# Patient Record
Sex: Female | Born: 1935 | Race: White | Hispanic: No | Smoking: Never smoker
Health system: Southern US, Community
[De-identification: ages and names within clinical notes are randomized; demographics above are authoritative.]

## PROBLEM LIST (undated history)

## (undated) DIAGNOSIS — F0390 Unspecified dementia without behavioral disturbance: Secondary | ICD-10-CM

## (undated) DIAGNOSIS — R7989 Other specified abnormal findings of blood chemistry: Secondary | ICD-10-CM

## (undated) DIAGNOSIS — Z9889 Other specified postprocedural states: Secondary | ICD-10-CM

## (undated) DIAGNOSIS — C3490 Malignant neoplasm of unspecified part of unspecified bronchus or lung: Secondary | ICD-10-CM

## (undated) HISTORY — PX: BREAST REDUCTION SURGERY: SHX8

## (undated) HISTORY — PX: OTHER SURGICAL HISTORY: SHX169

## (undated) HISTORY — PX: CARPAL TUNNEL RELEASE: SHX101

## (undated) HISTORY — PX: ABDOMINAL HYSTERECTOMY: SHX81

## (undated) HISTORY — PX: BACK SURGERY: SHX140

---

## 2013-02-15 ENCOUNTER — Encounter (HOSPITAL_COMMUNITY): Payer: Self-pay

## 2013-02-15 ENCOUNTER — Inpatient Hospital Stay (HOSPITAL_COMMUNITY)
Admission: EM | Admit: 2013-02-15 | Discharge: 2013-02-17 | DRG: 640 | Disposition: A | Discharge status: Skilled Nursing Facility | Payer: Medicare Other | Attending: Internal Medicine | Admitting: Internal Medicine

## 2013-02-15 ENCOUNTER — Emergency Department (HOSPITAL_COMMUNITY): Payer: Medicare Other

## 2013-02-15 DIAGNOSIS — R339 Retention of urine, unspecified: Secondary | ICD-10-CM | POA: Diagnosis present

## 2013-02-15 DIAGNOSIS — Z85118 Personal history of other malignant neoplasm of bronchus and lung: Secondary | ICD-10-CM

## 2013-02-15 DIAGNOSIS — I4510 Unspecified right bundle-branch block: Secondary | ICD-10-CM

## 2013-02-15 DIAGNOSIS — D72829 Elevated white blood cell count, unspecified: Secondary | ICD-10-CM | POA: Diagnosis present

## 2013-02-15 DIAGNOSIS — T502X5A Adverse effect of carbonic-anhydrase inhibitors, benzothiadiazides and other diuretics, initial encounter: Secondary | ICD-10-CM | POA: Diagnosis present

## 2013-02-15 DIAGNOSIS — N3289 Other specified disorders of bladder: Secondary | ICD-10-CM | POA: Diagnosis present

## 2013-02-15 DIAGNOSIS — E872 Acidosis, unspecified: Secondary | ICD-10-CM | POA: Diagnosis present

## 2013-02-15 DIAGNOSIS — E876 Hypokalemia: Secondary | ICD-10-CM

## 2013-02-15 DIAGNOSIS — E878 Other disorders of electrolyte and fluid balance, not elsewhere classified: Secondary | ICD-10-CM

## 2013-02-15 DIAGNOSIS — E871 Hypo-osmolality and hyponatremia: Principal | ICD-10-CM | POA: Diagnosis present

## 2013-02-15 DIAGNOSIS — Z79899 Other long term (current) drug therapy: Secondary | ICD-10-CM

## 2013-02-15 DIAGNOSIS — R4182 Altered mental status, unspecified: Secondary | ICD-10-CM

## 2013-02-15 DIAGNOSIS — E861 Hypovolemia: Secondary | ICD-10-CM | POA: Diagnosis present

## 2013-02-15 DIAGNOSIS — G9340 Encephalopathy, unspecified: Secondary | ICD-10-CM

## 2013-02-15 DIAGNOSIS — I10 Essential (primary) hypertension: Secondary | ICD-10-CM | POA: Diagnosis present

## 2013-02-15 DIAGNOSIS — F0390 Unspecified dementia without behavioral disturbance: Secondary | ICD-10-CM | POA: Diagnosis present

## 2013-02-15 HISTORY — DX: Unspecified dementia, unspecified severity, without behavioral disturbance, psychotic disturbance, mood disturbance, and anxiety: F03.90

## 2013-02-15 HISTORY — DX: Malignant neoplasm of unspecified part of unspecified bronchus or lung: C34.90

## 2013-02-15 HISTORY — DX: Other specified abnormal findings of blood chemistry: R79.89

## 2013-02-15 HISTORY — DX: Other specified postprocedural states: Z98.890

## 2013-02-15 LAB — COMPREHENSIVE METABOLIC PANEL
ALT: 10 U/L (ref 0–35)
Albumin: 3.7 g/dL (ref 3.5–5.2)
Calcium: 9.8 mg/dL (ref 8.4–10.5)
GFR calc Af Amer: 89 mL/min — ABNORMAL LOW (ref >90)
Glucose, Bld: 146 mg/dL — ABNORMAL HIGH (ref 70–99)
Sodium: 121 mEq/L — ABNORMAL LOW (ref 135–145)
Total Protein: 6.4 g/dL (ref 6.0–8.3)

## 2013-02-15 LAB — URINALYSIS, ROUTINE W REFLEX MICROSCOPIC
Bilirubin Urine: NEGATIVE
Glucose, UA: 100 mg/dL — AB
Leukocytes, UA: NEGATIVE
Nitrite: NEGATIVE
Specific Gravity, Urine: 1.015 (ref 1.005–1.030)
pH: 6.5 (ref 5.0–8.0)

## 2013-02-15 LAB — GLUCOSE, CAPILLARY: Glucose-Capillary: 151 mg/dL — ABNORMAL HIGH (ref 70–99)

## 2013-02-15 LAB — CBC WITH DIFFERENTIAL/PLATELET
Basophils Absolute: 0 10*3/uL (ref 0.0–0.1)
Basophils Relative: 0 % (ref 0–1)
Eosinophils Absolute: 0 10*3/uL (ref 0.0–0.7)
Eosinophils Relative: 0 % (ref 0–5)
Lymphs Abs: 1.5 10*3/uL (ref 0.7–4.0)
MCH: 28.5 pg (ref 26.0–34.0)
MCHC: 35.1 g/dL (ref 30.0–36.0)
MCV: 81.2 fL (ref 78.0–100.0)
Neutrophils Relative %: 88 % — ABNORMAL HIGH (ref 43–77)
Platelets: 331 10*3/uL (ref 150–400)
RDW: 12.2 % (ref 11.5–15.5)

## 2013-02-15 LAB — BASIC METABOLIC PANEL
BUN: 7 mg/dL (ref 6–23)
CO2: 30 mEq/L (ref 19–32)
Calcium: 9.5 mg/dL (ref 8.4–10.5)
Creatinine, Ser: 0.79 mg/dL (ref 0.50–1.10)
Glucose, Bld: 112 mg/dL — ABNORMAL HIGH (ref 70–99)

## 2013-02-15 LAB — LACTIC ACID, PLASMA: Lactic Acid, Venous: 6.6 mmol/L — ABNORMAL HIGH (ref 0.5–2.2)

## 2013-02-15 LAB — POCT I-STAT, CHEM 8
BUN: 7 mg/dL (ref 6–23)
Calcium, Ion: 1.1 mmol/L — ABNORMAL LOW (ref 1.13–1.30)
Chloride: 82 mEq/L — ABNORMAL LOW (ref 96–112)

## 2013-02-15 LAB — URINE MICROSCOPIC-ADD ON

## 2013-02-15 LAB — MRSA PCR SCREENING: MRSA by PCR: NEGATIVE

## 2013-02-15 MED ORDER — ONDANSETRON HCL 4 MG/2ML IJ SOLN: 4.0000 mg | Freq: Four times a day (QID) | INTRAMUSCULAR | Status: DC | PRN

## 2013-02-15 MED ORDER — HYDROCODONE-ACETAMINOPHEN 5-325 MG PO TABS
1.0000 | ORAL_TABLET | ORAL | Status: DC | PRN
  Administered 2013-02-16 – 2013-02-17 (×4): 1 via ORAL
  Filled 2013-02-15 (×4): qty 1

## 2013-02-15 MED ORDER — ENSURE COMPLETE PO LIQD
237.0000 mL | Freq: Two times a day (BID) | ORAL | Status: DC
  Administered 2013-02-15 – 2013-02-17 (×4): 237 mL via ORAL

## 2013-02-15 MED ORDER — SODIUM CHLORIDE 0.9 % IV SOLN
INTRAVENOUS | Status: DC
  Administered 2013-02-15 – 2013-02-16 (×2): via INTRAVENOUS
  Filled 2013-02-15 (×2): qty 1000

## 2013-02-15 MED ORDER — SERTRALINE HCL 25 MG PO TABS
12.5000 mg | ORAL_TABLET | Freq: Every morning | ORAL | Status: DC
  Administered 2013-02-16 – 2013-02-17 (×2): 12.5 mg via ORAL
  Filled 2013-02-15 (×2): qty 0.5

## 2013-02-15 MED ORDER — IOHEXOL 300 MG/ML  SOLN
100.0000 mL | Freq: Once | INTRAMUSCULAR | Status: AC | PRN
  Administered 2013-02-15: 100 mL via INTRAVENOUS

## 2013-02-15 MED ORDER — CLONAZEPAM 0.5 MG PO TBDP
0.5000 mg | ORAL_TABLET | Freq: Two times a day (BID) | ORAL | Status: DC
  Administered 2013-02-16 – 2013-02-17 (×3): 0.5 mg via ORAL
  Filled 2013-02-15 (×3): qty 1

## 2013-02-15 MED ORDER — ESTRADIOL 1 MG PO TABS: 0.5000 mg | ORAL_TABLET | Freq: Every day | ORAL | Status: DC

## 2013-02-15 MED ORDER — ACETAMINOPHEN 325 MG PO TABS
650.0000 mg | ORAL_TABLET | Freq: Four times a day (QID) | ORAL | Status: DC | PRN
  Administered 2013-02-16: 650 mg via ORAL
  Filled 2013-02-15: qty 2

## 2013-02-15 MED ORDER — IOHEXOL 300 MG/ML  SOLN
50.0000 mL | Freq: Once | INTRAMUSCULAR | Status: AC | PRN
  Administered 2013-02-15: 50 mL via ORAL

## 2013-02-15 MED ORDER — VANCOMYCIN HCL IN DEXTROSE 1-5 GM/200ML-% IV SOLN
1000.0000 mg | INTRAVENOUS | Status: AC
  Administered 2013-02-15: 1000 mg via INTRAVENOUS
  Filled 2013-02-15: qty 200

## 2013-02-15 MED ORDER — PANTOPRAZOLE SODIUM 40 MG PO TBEC
40.0000 mg | DELAYED_RELEASE_TABLET | Freq: Every day | ORAL | Status: DC
  Administered 2013-02-16 – 2013-02-17 (×2): 40 mg via ORAL
  Filled 2013-02-15 (×2): qty 1

## 2013-02-15 MED ORDER — VANCOMYCIN HCL 500 MG IV SOLR
100.0000 mg | INTRAVENOUS | Status: DC
  Filled 2013-02-15: qty 100

## 2013-02-15 MED ORDER — SODIUM CHLORIDE 0.9 % IJ SOLN
3.0000 mL | Freq: Two times a day (BID) | INTRAMUSCULAR | Status: DC
  Administered 2013-02-15 – 2013-02-17 (×3): 3 mL via INTRAVENOUS

## 2013-02-15 MED ORDER — ACETAMINOPHEN 650 MG RE SUPP: 650.0000 mg | Freq: Four times a day (QID) | RECTAL | Status: DC | PRN

## 2013-02-15 MED ORDER — ENSURE PLUS PO LIQD: 1.0000 | Freq: Two times a day (BID) | ORAL | Status: DC

## 2013-02-15 MED ORDER — SODIUM CHLORIDE 0.9 % IV SOLN
Freq: Once | INTRAVENOUS | Status: AC
  Administered 2013-02-15: 13:00:00 via INTRAVENOUS

## 2013-02-15 MED ORDER — GABAPENTIN 100 MG PO CAPS
100.0000 mg | ORAL_CAPSULE | Freq: Every day | ORAL | Status: DC
  Administered 2013-02-15 – 2013-02-16 (×2): 100 mg via ORAL
  Filled 2013-02-15 (×3): qty 1

## 2013-02-15 MED ORDER — PIPERACILLIN-TAZOBACTAM 3.375 G IVPB 30 MIN
3.3750 g | Freq: Three times a day (TID) | INTRAVENOUS | Status: DC
  Administered 2013-02-15 – 2013-02-17 (×6): 3.375 g via INTRAVENOUS
  Filled 2013-02-15 (×7): qty 50

## 2013-02-15 MED ORDER — ESTRADIOL 1 MG PO TABS
0.5000 mg | ORAL_TABLET | Freq: Every day | ORAL | Status: DC
  Administered 2013-02-15 – 2013-02-17 (×3): 0.5 mg via ORAL
  Filled 2013-02-15 (×3): qty 0.5

## 2013-02-15 MED ORDER — OMEGA-3-ACID ETHYL ESTERS 1 G PO CAPS
1.0000 g | ORAL_CAPSULE | Freq: Every day | ORAL | Status: DC
  Administered 2013-02-16 – 2013-02-17 (×2): 1 g via ORAL
  Filled 2013-02-15 (×2): qty 1

## 2013-02-15 MED ORDER — LORAZEPAM 2 MG/ML IJ SOLN
0.5000 mg | Freq: Once | INTRAMUSCULAR | Status: AC
  Administered 2013-02-15: 23:00:00 0.5 mg via INTRAVENOUS
  Filled 2013-02-15: qty 1

## 2013-02-15 MED ORDER — LORAZEPAM 0.5 MG PO TABS
0.2500 mg | ORAL_TABLET | Freq: Three times a day (TID) | ORAL | Status: DC | PRN
  Administered 2013-02-16: 14:00:00 0.25 mg via ORAL
  Filled 2013-02-15: qty 1

## 2013-02-15 MED ORDER — LIDOCAINE 5 % EX PTCH
1.0000 | MEDICATED_PATCH | CUTANEOUS | Status: DC
  Administered 2013-02-15 – 2013-02-16 (×2): 1 via TRANSDERMAL
  Filled 2013-02-15 (×3): qty 1

## 2013-02-15 MED ORDER — POTASSIUM CHLORIDE 10 MEQ/100ML IV SOLN
10.0000 meq | Freq: Once | INTRAVENOUS | Status: AC
  Administered 2013-02-15: 10 meq via INTRAVENOUS
  Filled 2013-02-15: qty 100

## 2013-02-15 MED ORDER — ONDANSETRON HCL 4 MG PO TABS: 4.0000 mg | ORAL_TABLET | Freq: Four times a day (QID) | ORAL | Status: DC | PRN

## 2013-02-15 MED ORDER — OMEGA-3 FATTY ACIDS 1000 MG PO CAPS: 1.0000 g | ORAL_CAPSULE | Freq: Every day | ORAL | Status: DC

## 2013-02-15 MED ORDER — ENOXAPARIN SODIUM 40 MG/0.4ML ~~LOC~~ SOLN
40.0000 mg | SUBCUTANEOUS | Status: DC
  Administered 2013-02-15 – 2013-02-16 (×2): 40 mg via SUBCUTANEOUS
  Filled 2013-02-15 (×3): qty 0.4

## 2013-02-15 NOTE — ED Notes (Signed)
Delay in transporting to 1401 due to pt taken over to CT scan

## 2013-02-15 NOTE — Progress Notes (Signed)
Pcp per [facility] is [facility] Talmadge Coventry per EDP noted Epic updated

## 2013-02-15 NOTE — Progress Notes (Signed)
UR completed 

## 2013-02-15 NOTE — ED Notes (Signed)
Per [facility] pt found on the floor at at [facility] in her room in memory care unit unknown time, bruise noted to lt hip, lt elbow. Pt alert and disoriented to her norm, c/o back pain only

## 2013-02-15 NOTE — Progress Notes (Signed)
ANTIBIOTIC CONSULT NOTE - INITIAL  Pharmacy Consult for:  Vancomycin Indication:  Empiric coverage for SIRS  Allergies  Allergen Reactions  . Compazine [Prochlorperazine]     Per [facility]    Patient Measurements: Height: 5\' 3"  (160 cm) Weight: 110 lb 7.2 oz (50.1 kg) IBW/kg (Calculated) : 52.4  Vital Signs: Temp: 98.5 F (36.9 C) (09/17 1445) Temp src: Rectal (09/17 1131) BP: 125/49 mmHg (09/17 1445) Pulse Rate: 100 (09/17 1445)   Intake/Output from this shift: Total I/O In: 120 [P.O.:120] Out: 500 [Urine:500]  Labs:  Recent Labs  02/15/13 1005 02/15/13 1124 02/15/13 1505  WBC 21.5*  --   --   HGB 12.3 12.2  --   PLT 331  --   --   CREATININE 0.79 0.90 0.79   Estimated Creatinine Clearance: 45.1 ml/min (by C-G formula based on Cr of 0.79).    Microbiology: No results found for this or any previous visit (from the past 720 hour(s)).  Medical History: Past Medical History  Diagnosis Date  . Dementia   . Azotemia   . Hx of breast reduction, elective   . Lung cancer     hx of radiation    Medications:  Scheduled:  . clonazePAM  0.5 mg Oral BID  . enoxaparin (LOVENOX) injection  40 mg Subcutaneous Q24H  . estradiol  0.5 mg Oral Daily  . feeding supplement  237 mL Oral BID BM  . gabapentin  100 mg Oral QHS  . lidocaine  1 patch Transdermal Q24H  . [START ON 02/16/2013] omega-3 acid ethyl esters  1 g Oral Daily  . [START ON 02/16/2013] pantoprazole  40 mg Oral Daily  . piperacillin-tazobactam  3.375 g Intravenous Q8H  . [START ON 02/16/2013] sertraline  12.5 mg Oral q morning - 10a  . sodium chloride  3 mL Intravenous Q12H   Assessment: Asked to assist with Vancomycin therapy for this 79 year-old female with altered mental status and leukocytosis with lactic acidosis.  Zosyn has also been ordered.  Goals of Therapy:   Vancomycin trough levels 15-20 mcg/ml  Eradication of infection  Plan:  Vancomycin 1000 mg x 1, then 500 mg every 12 hours.  Polo Riley R.Ph. 02/15/2013 5:50 PM

## 2013-02-15 NOTE — ED Notes (Signed)
Bed: WA13 Expected date:  Expected time:  Means of arrival:  Comments: EMS-fall 

## 2013-02-15 NOTE — ED Provider Notes (Signed)
CSN: [id number]     Arrival date & time 02/15/13  0911 History   First MD Initiated Contact with Patient 02/15/13 [phone]     Chief Complaint  Patient presents with  . Fall   (Consider location/radiation/quality/duration/timing/severity/associated sxs/prior Treatment) HPI Comments: Patient with hx dementia found on the ground by daughter.  Patient had had a bowel movement and was in a fetal position on the floor.  Was able to get up and ambulate but only with assistance.  At baseline, patient is verbal and ambulates on her own.  Patient lives in nursing home in memory care unit, recently moved to the area from [place].  Remote hx lung cancer, caught in early stages, treated with radiation.  Also has hx decreased renal function.    Level V caveat for dementia and AMS.   Patient is a 79 y.o. female presenting with fall. The history is provided by a relative.  Fall    Past Medical History  Diagnosis Date  . Dementia   . Azotemia   . Hx of breast reduction, elective   . Lung cancer    Past Surgical History  Procedure Laterality Date  . Abdominal hysterectomy     No family history on file. History  Substance Use Topics  . Smoking status: Never Smoker   . Smokeless tobacco: Not on file  . Alcohol Use: No   OB History   Grav Para Term Preterm Abortions TAB SAB Ect Mult Living                 Review of Systems  Unable to perform ROS: Dementia    Allergies  Review of patient's allergies indicates not on file.  Home Medications  No current outpatient prescriptions on file. BP 175/80  Pulse 108  Temp(Src) 97.9 F (36.6 C) (Oral)  Resp 17  SpO2 99% Physical Exam  Nursing note and vitals reviewed. Constitutional: She appears well-developed and well-nourished. No distress.  HENT:  Head: Normocephalic and atraumatic.  Neck: Neck supple.  Cardiovascular: Normal rate, regular rhythm and intact distal pulses.   Pulmonary/Chest: Effort normal and breath sounds normal. No  respiratory distress. She has no wheezes. She has no rales.  Abdominal: Soft. She exhibits no distension. There is tenderness. There is no rebound and no guarding.  Musculoskeletal: She exhibits no edema.       Legs: No specific bony tenderness elicited on exam.  No edema, no crepitus. Abrasion on left shin is old, healing.    Spine nontender, no crepitus, or stepoffs.   Neurological: She is alert.  Pt is alert but nonverbal.  Occasionally follows commands.   Skin: She is not diaphoretic.    ED Course  Procedures (including critical care time) Labs Review Labs Reviewed  GLUCOSE, CAPILLARY - Abnormal; Notable for the following:    Glucose-Capillary 151 (*)    All other components within normal limits  CBC WITH DIFFERENTIAL - Abnormal; Notable for the following:    WBC 21.5 (*)    HCT 35.0 (*)    Neutrophils Relative % 88 (*)    Neutro Abs 19.0 (*)    Lymphocytes Relative 7 (*)    All other components within normal limits  COMPREHENSIVE METABOLIC PANEL - Abnormal; Notable for the following:    Sodium 121 (*)    Potassium 2.9 (*)    Chloride 78 (*)    Glucose, Bld 146 (*)    GFR calc non Af Amer 77 (*)    GFR calc Af  Amer 89 (*)    All other components within normal limits  URINALYSIS, ROUTINE W REFLEX MICROSCOPIC - Abnormal; Notable for the following:    Glucose, UA 100 (*)    Hgb urine dipstick TRACE (*)    All other components within normal limits  CK - Abnormal; Notable for the following:    Total CK 228 (*)    All other components within normal limits  LACTIC ACID, PLASMA - Abnormal; Notable for the following:    Lactic Acid, Venous 6.6 (*)    All other components within normal limits  POCT I-STAT, CHEM 8 - Abnormal; Notable for the following:    Sodium 122 (*)    Potassium 2.6 (*)    Chloride 82 (*)    Glucose, Bld 125 (*)    Calcium, Ion 1.10 (*)    All other components within normal limits  CULTURE, BLOOD (ROUTINE X 2)  CULTURE, BLOOD (ROUTINE X 2)  URINE  CULTURE  URINE MICROSCOPIC-ADD ON   Imaging Review Dg Chest 2 View  02/15/2013   CLINICAL DATA:  Fall. Left shoulder pain. History of asthma and lung cancer.  EXAM: CHEST  2 VIEW  COMPARISON:  None.  FINDINGS: Peripherally density in the right upper lobe. This could reflect scarring although a pulmonary nodule cannot be excluded. Compares and old study if any outside studies are available would be helpful. Mild hyperinflation of the lungs. Heart is normal size. No additional focal airspace opacity. No effusion or acute bony abnormality.  IMPRESSION: Peripheral density in the right upper lobe. This may reflect scarring, but cannot exclude pulmonary nodule. Comparison to any outside studies would be helpful. If none are available, chest CT would be recommended.   Electronically Signed   By: Charlett Nose M.D.   On: 02/15/2013 11:08   Dg Pelvis 1-2 Views  02/15/2013   CLINICAL DATA:  Fall.  EXAM: PELVIS - 1-2 VIEW  COMPARISON:  None.  FINDINGS: Early degenerative changes in the hips bilaterally. No acute bony abnormality. Specifically, no fracture, subluxation, or dislocation. Soft tissues are intact. Posterior hardware noted in the lower lumbar spine.  IMPRESSION: No acute findings.   Electronically Signed   By: Charlett Nose M.D.   On: 02/15/2013 11:11   Ct Head Wo Contrast  02/15/2013   CLINICAL DATA:  Fall.  Initial encounter.  EXAM: CT HEAD WITHOUT CONTRAST  CT CERVICAL SPINE WITHOUT CONTRAST  TECHNIQUE: Multidetector CT imaging of the head and cervical spine was performed following the standard protocol without intravenous contrast. Multiplanar CT image reconstructions of the cervical spine were also generated.  COMPARISON:  None.  FINDINGS: CT HEAD FINDINGS  Mild atrophy and diffuse white matter changes are evident. A lacunar infarct is noted within the left caudate head. This appears remote with ex vacuo dilation of the adjacent ventricle. No acute cortical infarct, hemorrhage, or mass lesion is  present. The ventricles are proportionate to the degree of atrophy. No significant extra-axial fluid collection is present.  No significant extracranial injury is evident. And the paranasal sinuses and the mastoid air cells are clear. The osseous skull is intact.  CT CERVICAL SPINE FINDINGS  The cervical spine is imaged from the skull base through T1-2. Degenerative anterolisthesis is evident at C4-5 and to a lesser extent at C5-6. No acute bone or soft tissue abnormalities are present. Multilevel uncovertebral spurring is noted. Osseous foraminal narrowing is noted on the left at C3-4.  The soft tissues of the neck demonstrate mild lobulation  of the thyroid without a discrete lesion. The lung apices are clear.  IMPRESSION: CT HEAD IMPRESSION  1. Mild generalized atrophy and white matter disease. This likely reflects the sequela of chronic microvascular ischemia. 2. A lacunar infarct of the left caudate head appears remote. 3. No acute intracranial abnormality.  CT CERVICAL SPINE IMPRESSION  1. Multilevel spondylosis of the cervical spine with left osseous foraminal narrowing at C3-4. 2. No acute abnormality.   Electronically Signed   By: Gennette Pac   On: 02/15/2013 10:47   Ct Cervical Spine Wo Contrast  02/15/2013   CLINICAL DATA:  Fall.  Initial encounter.  EXAM: CT HEAD WITHOUT CONTRAST  CT CERVICAL SPINE WITHOUT CONTRAST  TECHNIQUE: Multidetector CT imaging of the head and cervical spine was performed following the standard protocol without intravenous contrast. Multiplanar CT image reconstructions of the cervical spine were also generated.  COMPARISON:  None.  FINDINGS: CT HEAD FINDINGS  Mild atrophy and diffuse white matter changes are evident. A lacunar infarct is noted within the left caudate head. This appears remote with ex vacuo dilation of the adjacent ventricle. No acute cortical infarct, hemorrhage, or mass lesion is present. The ventricles are proportionate to the degree of atrophy. No  significant extra-axial fluid collection is present.  No significant extracranial injury is evident. And the paranasal sinuses and the mastoid air cells are clear. The osseous skull is intact.  CT CERVICAL SPINE FINDINGS  The cervical spine is imaged from the skull base through T1-2. Degenerative anterolisthesis is evident at C4-5 and to a lesser extent at C5-6. No acute bone or soft tissue abnormalities are present. Multilevel uncovertebral spurring is noted. Osseous foraminal narrowing is noted on the left at C3-4.  The soft tissues of the neck demonstrate mild lobulation of the thyroid without a discrete lesion. The lung apices are clear.  IMPRESSION: CT HEAD IMPRESSION  1. Mild generalized atrophy and white matter disease. This likely reflects the sequela of chronic microvascular ischemia. 2. A lacunar infarct of the left caudate head appears remote. 3. No acute intracranial abnormality.  CT CERVICAL SPINE IMPRESSION  1. Multilevel spondylosis of the cervical spine with left osseous foraminal narrowing at C3-4. 2. No acute abnormality.   Electronically Signed   By: Gennette Pac   On: 02/15/2013 10:47   Dg Shoulder Left  02/15/2013   CLINICAL DATA:  Fall, left shoulder pain.  EXAM: LEFT SHOULDER - 2+ VIEW  COMPARISON:  None.  FINDINGS: Calcification noted at the rotator cuff insertion compatible with chronic tendonitis. Degenerative changes in the left AC joint. No fracture, subluxation or dislocation.  IMPRESSION: No acute findings. Chronic changes as above.   Electronically Signed   By: Charlett Nose M.D.   On: 02/15/2013 11:11    9:57 AM Discussed patient with Dr Fayrene Fearing who will also see the patient.   Pt seen and examined by Dr Fayrene Fearing after tech drained full bladder with in and out cath - he elicited no tenderness on abdominal exam.  After discussion will cancel CT abd/pelvis as patient was likely reacting to my pressing on her full bladder.  Will continue to follow with serial abdominal exams for now.    11:16 AM Reexamination of abdomen:  Soft, nondistended, TTP diffusely worst in RLQ, no guarding, no rebound.  Given leukocytosis and elevated lactic acid, will order CT abd/pelvis with contrast.   Daughter notes patient does have advanced directives and daughter is healthcare power of attorney.  She does not think that  patient is DNR/DNI - paperwork is at home.   PCP is [facility], Lincoln Maxin, MD.    Date: 02/15/2013  Rate: 93  Rhythm: normal sinus rhythm  QRS Axis: left  Intervals: normal  ST/T Wave abnormalities: nonspecific ST/T changes  Conduction Disutrbances:right bundle branch block  Narrative Interpretation: +artifact  Old EKG Reviewed: none available    MDM   1. Altered mental status   2. Hyponatremia   3. Hypokalemia   4. Hypochloremia   5. Leukocytosis   6. Lactic acidosis    Patient with hx dementia presents after being found on floor of nursing home room.  Presumed unwitnessed fall though unclear.  Tachycardic on arrival, though afebrile.  Hypertensive.  Pt intermittently speaks but is not at her baseline per daughter.  Pt found to have leukocytosis and lactic acidosis, also with hyponatremia, hypokalemia, hypochloremia.  Clinically dry on exam.  IVF running, IV potassium ordered.  Blood and urine cultures pending. UA and CXR do not show apparent infection. Pt with inconsistent exam but intermittent abdominal tenderness - CT abd/pelvis pending.  Pt admitted to [facility].  Hospitalist to see patient in ED, place own holding orders per his preference.     Trixie Dredge, PA-C 02/15/13 1221

## 2013-02-15 NOTE — H&P (Signed)
[facility] History and Physical  Mariya Mottley AVW:[id number] DOB: [birth year] DOA: 02/15/2013   PCP: Florentina Jenny, MD   Chief Complaint: acute encephalopathy  HPI:  79 year old female with a history of remote lung cancer, dementia, depression, chronic back pain, and hypertension presents from [facility] on her daughter found her on the floor after a presumed unwitnessed fall. The patient was awake but confused. The patient is unable to provide any history due to her dementia. All of this history is obtained from speaking with the patient's daughter at the bedside. When the daughter found the patient, the patient was more confused than usual, and it was noted that the patient was incontinent of stool. As a result, the patient was brought to the ED for further evaluation. At baseline, the patient is pleasantly confused, but is able to carry a conversation and recognize her children. Except for poor by mouth intake, the patient has been in her usual state of health. She did not require any assistive devices to ambulate. There's been no history of fevers, chills, chest discomfort, shortness breath, vomiting, diarrhea, falls, syncope.  The patient has recently moved from [place] to [place] to be closer to family. Her physician is in the process of weaning her opioids, and her Aricept and Namenda were recently discontinued approximately 3 weeks ago. In ED, the patient was found to have WBC 21.5, sodium 122, lactic acid 6.6. EKG showed right bundle-branch block without any prior comparison. Hepatic panel was unremarkable. Serum creatinine was 0.79. Chest x-ray negative for infiltrates. CT of the brain and CT of the cervical spine were negative for any acute abnormalities. X-ray of the pelvis and left shoulder were negative. Urinalysis was negative for pyuria. Assessment/Plan: Acute encephalopathy -Multifactorial including possible underlying infectious process as well as medications and hyponatremia -EEG  to clarify whether the patient had a seizure which may have accounted for her stool incontinence and encephalopathy -Minimize opioids and hypnotics -Patient's mentation is near baseline at the time of my examination -d/c zolpidem Hyponatremia -Likely volume depletion as well as medication induced from Maxzide -d/c Maxzide -gentle hydration -repeat BMP today to ensure slow Na correction -May also be due to Zoloft -Check urine osmolarity, serum osmolarity, urine sodium, urine creatinine -TSH Leukocytosis with lactic acidosis -Blood cultures -Empiric vancomycin and Zosyn -CT abdomen and pelvis--RLQ abd pain on exam Right bundle-branch block -No previous EKGs to compare -Patient's son who is a physician does not know if pt has hx of RBBB -cycle troponins Hypokalemia -Replete -Check magnesium       Past Medical History  Diagnosis Date  . Dementia   . Azotemia   . Hx of breast reduction, elective   . Lung cancer    Past Surgical History  Procedure Laterality Date  . Abdominal hysterectomy     Social History:  reports that she has never smoked. She does not have any smokeless tobacco history on file. She reports that she does not drink alcohol. Her drug history is not on file.   Family History:  Mother and father had some GI cancer  Allergies  Allergen Reactions  . Compazine [Prochlorperazine]     Per MAR      Prior to Admission medications   Medication Sig Start Date End Date Taking? Authorizing Provider  clonazePAM (KLONOPIN) 1 MG disintegrating tablet Take 1 mg by mouth 2 (two) times daily.   Yes Historical Provider, MD  ENSURE PLUS (ENSURE PLUS) LIQD Take 1 Can by mouth 2 (two) times daily between  meals.   Yes Historical Provider, MD  estradiol (ESTRACE) 0.5 MG tablet Take 0.5 mg by mouth daily.   Yes Historical Provider, MD  fish oil-omega-3 fatty acids 1000 MG capsule Take 1 g by mouth daily.   Yes Historical Provider, MD  gabapentin (NEURONTIN) 100 MG  capsule Take 100 mg by mouth at bedtime. For pain   Yes Historical Provider, MD  HYDROcodone-acetaminophen (NORCO/VICODIN) 5-325 MG per tablet Take 1 tablet by mouth 2 (two) times daily.   Yes Historical Provider, MD  lidocaine (LIDODERM) 5 % Place 1 patch onto the skin daily. Remove & Discard patch within 12 hours or as directed by MD. Apply to lower back   Yes Historical Provider, MD  LORazepam (ATIVAN) 0.5 MG tablet Take 0.25 mg by mouth 3 (three) times daily as needed for anxiety.   Yes Historical Provider, MD  ondansetron (ZOFRAN-ODT) 8 MG disintegrating tablet Take 8 mg by mouth every 8 (eight) hours as needed for nausea (and vomiting).   Yes Historical Provider, MD  pantoprazole (PROTONIX) 40 MG tablet Take 40 mg by mouth daily before breakfast.   Yes Historical Provider, MD  sertraline (ZOLOFT) 25 MG tablet Take 12.5 mg by mouth every morning. anxiety   Yes Historical Provider, MD  triamterene-hydrochlorothiazide (MAXZIDE-25) 37.5-25 MG per tablet Take 1 tablet by mouth every morning.   Yes Historical Provider, MD  zolpidem (AMBIEN) 5 MG tablet Take 5 mg by mouth at bedtime. For insomnia   Yes Historical Provider, MD    Review of Systems:  Unobtainable secondary to patient's dementia.  Physical Exam: Filed Vitals:   02/15/13 0915 02/15/13 1107 02/15/13 1131  BP: 175/80 144/56   Pulse: 108 80   Temp: 97.9 F (36.6 C) 98.1 F (36.7 C) 97.9 F (36.6 C)  TempSrc: Oral Oral Rectal  Resp: 17 19   SpO2: 99% 95%    General:  A&O x 1, NAD, nontoxic, pleasant/cooperative Head/Eye: No conjunctival hemorrhage, no icterus, [place]/AT, No nystagmus ENT:  No icterus,  No thrush, good dentition, no pharyngeal exudate Neck:  No masses, no lymphadenpathy, no bruits CV:  RRR, no rub, no gallop, no S3 Lung:  CTAB, good air movement, no wheeze, no rhonchi Abdomen: soft/, +BS, nondistended, no peritoneal signs; RLQ tender without rebound Ext: No cyanosis, No rashes, No petechiae, No lymphangitis,  trace LE edema Neuro: CNII-XII intact, strength 4-/5 in bilateral upper and lower extremities, no dysmetria  Labs on Admission:  Basic Metabolic Panel:  Recent Labs Lab 02/15/13 1005 02/15/13 1124  NA 121* 122*  K 2.9* 2.6*  CL 78* 82*  CO2 21  --   GLUCOSE 146* 125*  BUN 9 7  CREATININE 0.79 0.90  CALCIUM 9.8  --    Liver Function Tests:  Recent Labs Lab 02/15/13 1005  AST 19  ALT 10  ALKPHOS 58  BILITOT 1.0  PROT 6.4  ALBUMIN 3.7   No results found for this basename: LIPASE, AMYLASE,  in the last 168 hours No results found for this basename: AMMONIA,  in the last 168 hours CBC:  Recent Labs Lab 02/15/13 1005 02/15/13 1124  WBC 21.5*  --   NEUTROABS 19.0*  --   HGB 12.3 12.2  HCT 35.0* 36.0  MCV 81.2  --   PLT 331  --    Cardiac Enzymes:  Recent Labs Lab 02/15/13 1005  CKTOTAL 228*   BNP: No components found with this basename: POCBNP,  CBG:  Recent Labs Lab 02/15/13 0931  GLUCAP  151*    Radiological Exams on Admission: Dg Chest 2 View  02/15/2013   CLINICAL DATA:  Fall. Left shoulder pain. History of asthma and lung cancer.  EXAM: CHEST  2 VIEW  COMPARISON:  None.  FINDINGS: Peripherally density in the right upper lobe. This could reflect scarring although a pulmonary nodule cannot be excluded. Compares and old study if any outside studies are available would be helpful. Mild hyperinflation of the lungs. Heart is normal size. No additional focal airspace opacity. No effusion or acute bony abnormality.  IMPRESSION: Peripheral density in the right upper lobe. This may reflect scarring, but cannot exclude pulmonary nodule. Comparison to any outside studies would be helpful. If none are available, chest CT would be recommended.   Electronically Signed   By: Charlett Nose M.D.   On: 02/15/2013 11:08   Dg Pelvis 1-2 Views  02/15/2013   CLINICAL DATA:  Fall.  EXAM: PELVIS - 1-2 VIEW  COMPARISON:  None.  FINDINGS: Early degenerative changes in the hips  bilaterally. No acute bony abnormality. Specifically, no fracture, subluxation, or dislocation. Soft tissues are intact. Posterior hardware noted in the lower lumbar spine.  IMPRESSION: No acute findings.   Electronically Signed   By: Charlett Nose M.D.   On: 02/15/2013 11:11   Ct Head Wo Contrast  02/15/2013   CLINICAL DATA:  Fall.  Initial encounter.  EXAM: CT HEAD WITHOUT CONTRAST  CT CERVICAL SPINE WITHOUT CONTRAST  TECHNIQUE: Multidetector CT imaging of the head and cervical spine was performed following the standard protocol without intravenous contrast. Multiplanar CT image reconstructions of the cervical spine were also generated.  COMPARISON:  None.  FINDINGS: CT HEAD FINDINGS  Mild atrophy and diffuse white matter changes are evident. A lacunar infarct is noted within the left caudate head. This appears remote with ex vacuo dilation of the adjacent ventricle. No acute cortical infarct, hemorrhage, or mass lesion is present. The ventricles are proportionate to the degree of atrophy. No significant extra-axial fluid collection is present.  No significant extracranial injury is evident. And the paranasal sinuses and the mastoid air cells are clear. The osseous skull is intact.  CT CERVICAL SPINE FINDINGS  The cervical spine is imaged from the skull base through T1-2. Degenerative anterolisthesis is evident at C4-5 and to a lesser extent at C5-6. No acute bone or soft tissue abnormalities are present. Multilevel uncovertebral spurring is noted. Osseous foraminal narrowing is noted on the left at C3-4.  The soft tissues of the neck demonstrate mild lobulation of the thyroid without a discrete lesion. The lung apices are clear.  IMPRESSION: CT HEAD IMPRESSION  1. Mild generalized atrophy and white matter disease. This likely reflects the sequela of chronic microvascular ischemia. 2. A lacunar infarct of the left caudate head appears remote. 3. No acute intracranial abnormality.  CT CERVICAL SPINE IMPRESSION   1. Multilevel spondylosis of the cervical spine with left osseous foraminal narrowing at C3-4. 2. No acute abnormality.   Electronically Signed   By: Gennette Pac   On: 02/15/2013 10:47   Ct Cervical Spine Wo Contrast  02/15/2013   CLINICAL DATA:  Fall.  Initial encounter.  EXAM: CT HEAD WITHOUT CONTRAST  CT CERVICAL SPINE WITHOUT CONTRAST  TECHNIQUE: Multidetector CT imaging of the head and cervical spine was performed following the standard protocol without intravenous contrast. Multiplanar CT image reconstructions of the cervical spine were also generated.  COMPARISON:  None.  FINDINGS: CT HEAD FINDINGS  Mild atrophy and diffuse white  matter changes are evident. A lacunar infarct is noted within the left caudate head. This appears remote with ex vacuo dilation of the adjacent ventricle. No acute cortical infarct, hemorrhage, or mass lesion is present. The ventricles are proportionate to the degree of atrophy. No significant extra-axial fluid collection is present.  No significant extracranial injury is evident. And the paranasal sinuses and the mastoid air cells are clear. The osseous skull is intact.  CT CERVICAL SPINE FINDINGS  The cervical spine is imaged from the skull base through T1-2. Degenerative anterolisthesis is evident at C4-5 and to a lesser extent at C5-6. No acute bone or soft tissue abnormalities are present. Multilevel uncovertebral spurring is noted. Osseous foraminal narrowing is noted on the left at C3-4.  The soft tissues of the neck demonstrate mild lobulation of the thyroid without a discrete lesion. The lung apices are clear.  IMPRESSION: CT HEAD IMPRESSION  1. Mild generalized atrophy and white matter disease. This likely reflects the sequela of chronic microvascular ischemia. 2. A lacunar infarct of the left caudate head appears remote. 3. No acute intracranial abnormality.  CT CERVICAL SPINE IMPRESSION  1. Multilevel spondylosis of the cervical spine with left osseous foraminal  narrowing at C3-4. 2. No acute abnormality.   Electronically Signed   By: Gennette Pac   On: 02/15/2013 10:47   Dg Shoulder Left  02/15/2013   CLINICAL DATA:  Fall, left shoulder pain.  EXAM: LEFT SHOULDER - 2+ VIEW  COMPARISON:  None.  FINDINGS: Calcification noted at the rotator cuff insertion compatible with chronic tendonitis. Degenerative changes in the left AC joint. No fracture, subluxation or dislocation.  IMPRESSION: No acute findings. Chronic changes as above.   Electronically Signed   By: Charlett Nose M.D.   On: 02/15/2013 11:11    EKG: Independently reviewed. RBBB, sinus    Time spent:70 minutes Code Status:   FULL Family Communication:   Daughter at bedside   Eliakim Tendler, DO  [facility] Pager [phone]  If 7PM-7AM, please contact night-coverage [email or web address] Password [email or web address] 02/15/2013, 1:00 PM

## 2013-02-15 NOTE — Care Management Note (Addendum)
    Page 1 of 2   02/17/2013     12:04:44 PM   CARE MANAGEMENT NOTE 02/17/2013  Patient:  Jeanne Compton, Jeanne Compton   Account Number:  [id number]  Date Initiated:  02/15/2013  Documentation initiated by:  Lanier Clam  Subjective/Objective Assessment:   79 Y/O F ADMITTED W/ACUTE ENCEPHALOPATHY.     Action/Plan:   PERNSG ADMISSION-FROM ALF-[facility].   Anticipated DC Date:  02/17/2013   Anticipated DC Plan:  ASSISTED LIVING / REST HOME      DC Planning Services  CM consult      Choice offered to / List presented to:  C-1 Patient   DME arranged  Levan Hurst      DME agency  [facility]     HH arranged  HH-2 PT  HH-3 OT  HH-6 SOCIAL WORKER      HH agency  [facility]   Status of service:  Completed, signed off Medicare Important Message given?   (If response is "NO", the following Medicare IM given date fields will be blank) Date Medicare IM given:   Date Additional Medicare IM given:    Discharge Disposition:  HOME W HOME HEALTH SERVICES  Per UR Regulation:  Reviewed for med. necessity/level of care/duration of stay  If discussed at Long Length of Stay Meetings, dates discussed:    Comments:  02/17/13 Alaynna Kerwood RN,BSN NCM [phone] RECEIVED HHPT/OT/SW ORDER-[facility] IS THE CONTRACT [facility]ENC[facility] @ [facility] & PATIENT AGREED TO USE THEM.;ROLLING [facility] DME REP NOTIFIED.FAXED HH ORDERS W/CONFIRMATION TO:[phone],SPOKE TO [facility].  PT-HHPT/OT,RW.AWAITING FINAL HH ORDERS.TC [facility]-SPOKE TO [facility]-THEY USE [facility] BUT PATIENT CAN CHOOSE ANY HHC AGENCY.PATIENT CHOSE [facility].MD UPDATED.  02/15/13 Soliyana Mcchristian RN,BSN NCM [phone] WOULD RECOMMEND PT/OTEVAL.

## 2013-02-16 ENCOUNTER — Inpatient Hospital Stay (HOSPITAL_COMMUNITY)
Admit: 2013-02-16 | Discharge: 2013-02-16 | Disposition: A | Discharge status: Home / Self Care | Payer: Medicare Other | Attending: Internal Medicine | Admitting: Internal Medicine

## 2013-02-16 DIAGNOSIS — D72829 Elevated white blood cell count, unspecified: Secondary | ICD-10-CM

## 2013-02-16 DIAGNOSIS — I359 Nonrheumatic aortic valve disorder, unspecified: Secondary | ICD-10-CM

## 2013-02-16 DIAGNOSIS — R4182 Altered mental status, unspecified: Secondary | ICD-10-CM

## 2013-02-16 LAB — BASIC METABOLIC PANEL
Chloride: 97 mEq/L (ref 96–112)
Creatinine, Ser: 0.9 mg/dL (ref 0.50–1.10)
GFR calc Af Amer: 69 mL/min — ABNORMAL LOW (ref >90)
Potassium: 2.9 mEq/L — ABNORMAL LOW (ref 3.5–5.1)
Sodium: 137 mEq/L (ref 135–145)

## 2013-02-16 LAB — URINE CULTURE
Colony Count: NO GROWTH
Culture: NO GROWTH

## 2013-02-16 LAB — CBC
HCT: 33.5 % — ABNORMAL LOW (ref 36.0–46.0)
Hemoglobin: 11.5 g/dL — ABNORMAL LOW (ref 12.0–15.0)
RBC: 4.02 MIL/uL (ref 3.87–5.11)
RDW: 12.7 % (ref 11.5–15.5)
WBC: 11.6 10*3/uL — ABNORMAL HIGH (ref 4.0–10.5)

## 2013-02-16 LAB — TSH: TSH: 0.416 u[IU]/mL (ref 0.350–4.500)

## 2013-02-16 MED ORDER — LOPERAMIDE HCL 2 MG PO CAPS
2.0000 mg | ORAL_CAPSULE | Freq: Three times a day (TID) | ORAL | Status: DC | PRN
  Administered 2013-02-16: 22:00:00 2 mg via ORAL
  Filled 2013-02-16: qty 1

## 2013-02-16 MED ORDER — VANCOMYCIN HCL 500 MG IV SOLR
500.0000 mg | Freq: Two times a day (BID) | INTRAVENOUS | Status: DC
  Administered 2013-02-16 – 2013-02-17 (×3): 500 mg via INTRAVENOUS
  Filled 2013-02-16 (×3): qty 500

## 2013-02-16 MED ORDER — POTASSIUM CHLORIDE CRYS ER 20 MEQ PO TBCR
30.0000 meq | EXTENDED_RELEASE_TABLET | Freq: Two times a day (BID) | ORAL | Status: AC
  Administered 2013-02-16 (×2): 30 meq via ORAL
  Filled 2013-02-16 (×4): qty 1

## 2013-02-16 NOTE — Discharge Summary (Signed)
Physician Discharge Summary  Jeanne [facility]:[id number] DOB: 1932/05/08 DOA: 02/15/2013  PCP: Talmadge Coventry, MD  Admit date: 02/15/2013 Discharge date: 02/17/2013  Recommendations for Outpatient Follow-up:  1. Pt will need to follow up with PCP in 2 weeks post discharge 2. Please obtain BMP to evaluate electrolytes and kidney function 3. Please also check CBC to evaluate Hg and Hct levels 4.   Discharge Diagnoses:  Active Problems:   Acute encephalopathy   Hyponatremia   Hypokalemia   Lactic acidosis   RBBB Acute encephalopathy  - Significant improvement after 24 hours -back to baseline  -Multifactorial including possible underlying infectious process as well as medications and hyponatremia  -EEG negative for epileptiform discharges -Minimize opioids and hypnotics  -d/c zolpidem  Hyponatremia  -Likely volume depletion as well as medication induced from Maxzide  -d/c Maxzide--will not restart at discharge  - Improved with IV fluids  - Saline lock IV fluids  -May also be due to Zoloft  -Check urine osmolarity, serum osmolarity--did not suggest SIADH  -TSH--0.416  Leukocytosis with lactic acidosis  -Blood cultures--negative to date - Urine cultures negative to date - Initially started empiric vancomycin and Zosyn  -Vancomycin and Zosyn discontinued after cultures remained negative -CT abdomen and pelvis--distended bladder, normal appendix, no colonic wall thickening  -C. difficile PCR negative Right bundle-branch block  -No previous EKGs to compare  -Patient's son who is a physician does not know if pt has hx of RBBB  -cycle troponins--neg  - Echocardiogram EF 60-65%, no wall motion abnormality, grade 1 diastolic dysfunction. Hypokalemia  -Replete  -Check magnesium--1.7  Bladder distention/urine retention  - Foley catheter was initially placed - Foley catheter was discontinued, and the patient voided spontaneously Deconditioning -Physical therapy was  consulted -Home health physical therapy and OT were recommended, both of which were set up prior to discharge with the assistance of case management Family Communication: Pt at beside  Disposition Plan: Jeanne [facility] Antibiotics:  Zosyn 02/15/2013>>> 9/19 Vancomycin 02/15/2013>>>9/19   Discharge Condition: Stable  Disposition:  [facility]  Diet:regular Wt Readings from Last 3 Encounters:  02/15/13 50.1 kg (110 lb 7.2 oz)    History of present illness:  78 year old female with a history of remote lung cancer, dementia, depression, chronic back pain, and hypertension presents from [facility] on her daughter found her on the floor after a presumed unwitnessed fall. The patient was awake but confused. The patient is unable to provide any history due to her dementia. All of this history is obtained from speaking with the patient's daughter at the bedside. When the daughter found the patient, the patient was more confused than usual, and it was noted that the patient was incontinent of stool. As a result, the patient was brought to the ED for further evaluation. At baseline, the patient is pleasantly confused, but is able to carry a conversation and recognize her children. Except for poor by mouth intake, the patient has been in her usual state of health. She did not require any assistive devices to ambulate. There's been no history of fevers, chills, chest discomfort, shortness breath, vomiting, diarrhea, falls, syncope.  The patient has recently moved from [place] to [place] to be closer to family. Her physician is in the process of weaning her opioids, and her Aricept and Namenda were recently discontinued approximately 3 weeks ago. In ED, the patient was found to have WBC 21.5, sodium 122, lactic acid 6.6. EKG showed right bundle-branch block without any prior comparison. Hepatic panel was unremarkable. Serum  creatinine was 0.79. Chest x-ray negative for infiltrates. CT of the brain and CT of  the cervical spine were negative for any acute abnormalities. X-ray of the pelvis and left shoulder were negative. Urinalysis was negative for pyuria  EEG was ordered because of concern for possible seizure. He was negative for any epileptiform discharges. Troponins were negative. The patient's hyponatremia was thought to be due to a combination of volume depletion, medications including her diuretic. Her Maxzide was discontinued. The patient's blood pressure remained stable. She would not be restarted on that side. The patient was started on judicious fluids. The patient's serum sodium improved. As a result, the patient's mentation also improved. The patient was started on empiric vancomycin and Zosyn after culture data were obtained. The patient's cultures remained negative. Her antibiotics were discontinued. The patient remained hemodynamically stable and afebrile throughout the hospitalization. The patient's initial lactic acid was 6.6 which was likely to be able to her hypoperfusion and hypovolemic status. EKG showed right bundle branch block. The patient did not experience any chest discomfort shortness of breath. Nevertheless, her troponins were obtained and were negative. Echocardiogram showed EF 60-65%, grade 1 diastolic dysfunction, no wall motion abnormalities.  The patient was also on Ativan and clonazepam. The dose of her clonazepam was decreased. Ambien and Ativan will be discontinued at the time of discharge. The patient was maintained on her Zoloft. The patient's mentation gradually improved and returned to baseline. The patient developed loose stools during her hospitalization. C. difficile PCR was negative.   Discharge Exam: Filed Vitals:   02/17/13 1008  BP: 109/52  Pulse:   Temp:   Resp:    Filed Vitals:   02/16/13 2053 02/17/13 0511 02/17/13 1006 02/17/13 1008  BP: 125/49 98/51 96/45  109/52  Pulse: 69 68    Temp: 98.2 F (36.8 C) 97.7 F (36.5 C)    TempSrc: Oral Oral     Resp: 18 18    Height:      Weight:      SpO2: 99% 98%     General: A&O x 3, NAD, pleasant, cooperative Cardiovascular: RRR, no rub, no gallop, no S3 Respiratory: CTAB, no wheeze, no rhonchi Abdomen:soft, nontender, nondistended, positive bowel sounds Extremities: No edema, No lymphangitis, no petechiae  Discharge Instructions      Discharge Orders   Future Orders Complete By Expires   Diet - low sodium heart healthy  As directed    Discharge instructions  As directed    Comments:     Stop Maxzide Stop ambien Stop ativan   Increase activity slowly  As directed        Medication List    STOP taking these medications       LORazepam 0.5 MG tablet  Commonly known as:  ATIVAN     triamterene-hydrochlorothiazide 37.5-25 MG per tablet  Commonly known as:  MAXZIDE-25     zolpidem 5 MG tablet  Commonly known as:  AMBIEN      TAKE these medications       clonazePAM 0.5 MG disintegrating tablet  Commonly known as:  KLONOPIN  Take 1 tablet (0.5 mg total) by mouth 2 (two) times daily.     ENSURE PLUS Liqd  Take 1 Can by mouth 2 (two) times daily between meals.     estradiol 0.5 MG tablet  Commonly known as:  ESTRACE  Take 0.5 mg by mouth daily.     fish oil-omega-3 fatty acids 1000 MG capsule  Take 1 g by  mouth daily.     gabapentin 100 MG capsule  Commonly known as:  NEURONTIN  Take 100 mg by mouth at bedtime. For pain     HYDROcodone-acetaminophen 5-325 MG per tablet  Commonly known as:  NORCO/VICODIN  Take 1 tablet by mouth every 4 (four) hours as needed.     HYDROcodone-acetaminophen 5-325 MG per tablet  Commonly known as:  NORCO/VICODIN  Take 1 tablet by mouth 2 (two) times daily.     lidocaine 5 %  Commonly known as:  LIDODERM  Place 1 patch onto the skin daily. Remove & Discard patch within 12 hours or as directed by MD. Apply to lower back     ondansetron 8 MG disintegrating tablet  Commonly known as:  ZOFRAN-ODT  Take 8 mg by mouth every 8  (eight) hours as needed for nausea (and vomiting).     pantoprazole 40 MG tablet  Commonly known as:  PROTONIX  Take 40 mg by mouth daily before breakfast.     sertraline 25 MG tablet  Commonly known as:  ZOLOFT  Take 12.5 mg by mouth every morning. anxiety         The results of significant diagnostics from this hospitalization (including imaging, microbiology, ancillary and laboratory) are listed below for reference.    Significant Diagnostic Studies: Dg Chest 2 View  02/15/2013   CLINICAL DATA:  Fall. Left shoulder pain. History of asthma and lung cancer.  EXAM: CHEST  2 VIEW  COMPARISON:  None.  FINDINGS: Peripherally density in the right upper lobe. This could reflect scarring although a pulmonary nodule cannot be excluded. Compares and old study if any outside studies are available would be helpful. Mild hyperinflation of the lungs. Heart is normal size. No additional focal airspace opacity. No effusion or acute bony abnormality.  IMPRESSION: Peripheral density in the right upper lobe. This may reflect scarring, but cannot exclude pulmonary nodule. Comparison to any outside studies would be helpful. If none are available, chest CT would be recommended.   Electronically Signed   By: Charlett Nose M.D.   On: 02/15/2013 11:08   Dg Pelvis 1-2 Views  02/15/2013   CLINICAL DATA:  Fall.  EXAM: PELVIS - 1-2 VIEW  COMPARISON:  None.  FINDINGS: Early degenerative changes in the hips bilaterally. No acute bony abnormality. Specifically, no fracture, subluxation, or dislocation. Soft tissues are intact. Posterior hardware noted in the lower lumbar spine.  IMPRESSION: No acute findings.   Electronically Signed   By: Charlett Nose M.D.   On: 02/15/2013 11:11   Ct Head Wo Contrast  02/15/2013   CLINICAL DATA:  Fall.  Initial encounter.  EXAM: CT HEAD WITHOUT CONTRAST  CT CERVICAL SPINE WITHOUT CONTRAST  TECHNIQUE: Multidetector CT imaging of the head and cervical spine was performed following the  standard protocol without intravenous contrast. Multiplanar CT image reconstructions of the cervical spine were also generated.  COMPARISON:  None.  FINDINGS: CT HEAD FINDINGS  Mild atrophy and diffuse white matter changes are evident. A lacunar infarct is noted within the left caudate head. This appears remote with ex vacuo dilation of the adjacent ventricle. No acute cortical infarct, hemorrhage, or mass lesion is present. The ventricles are proportionate to the degree of atrophy. No significant extra-axial fluid collection is present.  No significant extracranial injury is evident. And the paranasal sinuses and the mastoid air cells are clear. The osseous skull is intact.  CT CERVICAL SPINE FINDINGS  The cervical spine is imaged from the  skull base through T1-2. Degenerative anterolisthesis is evident at C4-5 and to a lesser extent at C5-6. No acute bone or soft tissue abnormalities are present. Multilevel uncovertebral spurring is noted. Osseous foraminal narrowing is noted on the left at C3-4.  The soft tissues of the neck demonstrate mild lobulation of the thyroid without a discrete lesion. The lung apices are clear.  IMPRESSION: CT HEAD IMPRESSION  1. Mild generalized atrophy and white matter disease. This likely reflects the sequela of chronic microvascular ischemia. 2. A lacunar infarct of the left caudate head appears remote. 3. No acute intracranial abnormality.  CT CERVICAL SPINE IMPRESSION  1. Multilevel spondylosis of the cervical spine with left osseous foraminal narrowing at C3-4. 2. No acute abnormality.   Electronically Signed   By: Gennette Pac   On: 02/15/2013 10:47   Ct Cervical Spine Wo Contrast  02/15/2013   CLINICAL DATA:  Fall.  Initial encounter.  EXAM: CT HEAD WITHOUT CONTRAST  CT CERVICAL SPINE WITHOUT CONTRAST  TECHNIQUE: Multidetector CT imaging of the head and cervical spine was performed following the standard protocol without intravenous contrast. Multiplanar CT image  reconstructions of the cervical spine were also generated.  COMPARISON:  None.  FINDINGS: CT HEAD FINDINGS  Mild atrophy and diffuse white matter changes are evident. A lacunar infarct is noted within the left caudate head. This appears remote with ex vacuo dilation of the adjacent ventricle. No acute cortical infarct, hemorrhage, or mass lesion is present. The ventricles are proportionate to the degree of atrophy. No significant extra-axial fluid collection is present.  No significant extracranial injury is evident. And the paranasal sinuses and the mastoid air cells are clear. The osseous skull is intact.  CT CERVICAL SPINE FINDINGS  The cervical spine is imaged from the skull base through T1-2. Degenerative anterolisthesis is evident at C4-5 and to a lesser extent at C5-6. No acute bone or soft tissue abnormalities are present. Multilevel uncovertebral spurring is noted. Osseous foraminal narrowing is noted on the left at C3-4.  The soft tissues of the neck demonstrate mild lobulation of the thyroid without a discrete lesion. The lung apices are clear.  IMPRESSION: CT HEAD IMPRESSION  1. Mild generalized atrophy and white matter disease. This likely reflects the sequela of chronic microvascular ischemia. 2. A lacunar infarct of the left caudate head appears remote. 3. No acute intracranial abnormality.  CT CERVICAL SPINE IMPRESSION  1. Multilevel spondylosis of the cervical spine with left osseous foraminal narrowing at C3-4. 2. No acute abnormality.   Electronically Signed   By: Gennette Pac   On: 02/15/2013 10:47   Ct Abdomen Pelvis W Contrast  02/15/2013   CLINICAL DATA:  Fall. Right lower quadrant pain. Elevated white blood cell count.  EXAM: CT ABDOMEN AND PELVIS WITH CONTRAST  TECHNIQUE: Multidetector CT imaging of the abdomen and pelvis was performed using the standard protocol following bolus administration of intravenous contrast.  CONTRAST:  50mL OMNIPAQUE IOHEXOL 300 MG/ML SOLN, OMNIPAQUE  IOHEXOL 300 MG/ML SOLN  COMPARISON:  None.  FINDINGS: Diffuse hepatic steatosis.  Gallbladder, spleen, pancreas, adrenal glands are within normal limits.  Chronic changes of both kidneys are present. Dystrophic calcifications in the right kidney have a chronic appearance. Simple cyst in the left kidney. Tiny cyst in lower pole of the right kidney.  Normal appendix. No evidence of wall thickening of the ascending colon or cecum.  The bladder is markedly distended.  Uterus is absent. The adnexa are unremarkable.  Postoperative changes in  the lumbar spine with posterior L4-5 interspinous fusion hardware. Five small hiatal hernia.  No free-fluid.  IMPRESSION: Marked distention of the bladder. Decompression made alleviate the patient's symptoms.  Normal appendix.   Electronically Signed   By: Maryclare Bean M.D.   On: 02/15/2013 14:28   Dg Shoulder Left  02/15/2013   CLINICAL DATA:  Fall, left shoulder pain.  EXAM: LEFT SHOULDER - 2+ VIEW  COMPARISON:  None.  FINDINGS: Calcification noted at the rotator cuff insertion compatible with chronic tendonitis. Degenerative changes in the left AC joint. No fracture, subluxation or dislocation.  IMPRESSION: No acute findings. Chronic changes as above.   Electronically Signed   By: Charlett Nose M.D.   On: 02/15/2013 11:11     Microbiology: Recent Results (from the past 240 hour(s))  URINE CULTURE     Status: None   Collection Time    02/15/13 10:20 AM      Result Value Range Status   Specimen Description URINE, CATHETERIZED   Final   Special Requests NONE   Final   Culture  Setup Time     Final   Value: 02/15/2013 16:19     Performed at [facility] Count     Final   Value: NO GROWTH     Performed at [facility]   Culture     Final   Value: NO GROWTH     Performed at [facility]   Report Status 02/16/2013 FINAL   Final  CULTURE, BLOOD (ROUTINE X 2)     Status: None   Collection Time    02/15/13 11:14 AM      Result Value  Range Status   Specimen Description BLOOD RIGHT ARM   Final   Special Requests BOTTLES DRAWN AEROBIC ONLY 5CC   Final   Culture  Setup Time     Final   Value: 02/15/2013 14:55     Performed at [facility]   Culture     Final   Value:        BLOOD CULTURE RECEIVED NO GROWTH TO DATE CULTURE WILL BE HELD FOR 5 DAYS BEFORE ISSUING A FINAL NEGATIVE REPORT     Performed at [facility]   Report Status PENDING   Incomplete  CULTURE, BLOOD (ROUTINE X 2)     Status: None   Collection Time    02/15/13 11:20 AM      Result Value Range Status   Specimen Description BLOOD RIGHT ARM   Final   Special Requests BOTTLES DRAWN AEROBIC AND ANAEROBIC 4CC   Final   Culture  Setup Time     Final   Value: 02/15/2013 14:55     Performed at [facility]   Culture     Final   Value:        BLOOD CULTURE RECEIVED NO GROWTH TO DATE CULTURE WILL BE HELD FOR 5 DAYS BEFORE ISSUING A FINAL NEGATIVE REPORT     Performed at [facility]   Report Status PENDING   Incomplete  MRSA PCR SCREENING     Status: None   Collection Time    02/15/13  2:52 PM      Result Value Range Status   MRSA by PCR NEGATIVE  NEGATIVE Final   Comment:            The GeneXpert MRSA Assay (FDA     approved for NASAL specimens     only),  is one component of a     comprehensive MRSA colonization     surveillance program. It is not     intended to diagnose MRSA     infection nor to guide or     monitor treatment for     MRSA infections.  CLOSTRIDIUM DIFFICILE BY PCR     Status: None   Collection Time    02/16/13  8:45 PM      Result Value Range Status   C difficile by pcr NEGATIVE  NEGATIVE Final   Comment: Performed at [facility]     Labs: Basic Metabolic Panel:  Recent Labs Lab 02/15/13 1005 02/15/13 1124 02/15/13 1505 02/15/13 1506 02/16/13 0447 02/17/13 0440  NA 121* 122* 126*  --  137 140  K 2.9* 2.6* 2.8*  --  2.9* 3.7  CL 78* 82* 84*  --  97 106  CO2 21  --  30  --  31 29   GLUCOSE 146* 125* 112*  --  88 78  BUN 9 7 7   --  6 7  CREATININE 0.79 0.90 0.79  --  0.90 1.00  CALCIUM 9.8  --  9.5  --  9.2 8.8  MG  --   --   --  1.7  --  1.8   Liver Function Tests:  Recent Labs Lab 02/15/13 1005  AST 19  ALT 10  ALKPHOS 58  BILITOT 1.0  PROT 6.4  ALBUMIN 3.7   No results found for this basename: LIPASE, AMYLASE,  in the last 168 hours No results found for this basename: AMMONIA,  in the last 168 hours CBC:  Recent Labs Lab 02/15/13 1005 02/15/13 1124 02/16/13 0447 02/17/13 0440  WBC 21.5*  --  11.6* 9.2  NEUTROABS 19.0*  --   --   --   HGB 12.3 12.2 11.5* 10.1*  HCT 35.0* 36.0 33.5* 30.5*  MCV 81.2  --  83.3 85.9  PLT 331  --  281 269   Cardiac Enzymes:  Recent Labs Lab 02/15/13 1005 02/15/13 1505 02/15/13 2012  CKTOTAL 228*  --   --   TROPONINI  --  <0.30 <0.30   BNP: No components found with this basename: POCBNP,  CBG:  Recent Labs Lab 02/15/13 0931  GLUCAP 151*    Time coordinating discharge:  Greater than 30 minutes  Signed:  Loana Salvaggio, DO [facility] Pager: [phone] 02/17/2013, 12:01 PM

## 2013-02-16 NOTE — Progress Notes (Signed)
[facility] PROGRESS NOTE  Jeanne Compton ZOX:[id number] DOB: 11-28-1931 DOA: 02/15/2013 PCP: MAZZOCCHI, Rise Mu, MD  Assessment/Plan: Acute encephalopathy  - Significant improvement in the last 24 hours -Multifactorial including possible underlying infectious process as well as medications and hyponatremia  -EEG to clarify whether the patient had a seizure which may have accounted for her stool incontinence and encephalopathy  -Minimize opioids and hypnotics  -d/c zolpidem  Hyponatremia  -Likely volume depletion as well as medication induced from Maxzide  -d/c Maxzide  - Improved with IV fluids - Saline lock IV fluids -May also be due to Zoloft  -Check urine osmolarity, serum osmolarity, urine sodium, urine creatinine  -TSH--0.416 Leukocytosis with lactic acidosis  -Blood cultures  -Continue empiric vancomycin and Zosyn  -CT abdomen and pelvis--distended bladder, normal appendix, no colonic wall thickening Right bundle-branch block  -No previous EKGs to compare  -Patient's son who is a physician does not know if pt has hx of RBBB  -cycle troponins--neg Hypokalemia  -Replete  -Check magnesium--1.7 Bladder distention/urine retention - Voiding trial prior to discharge  Family Communication:   Pt at beside Disposition Plan:   [facility] when medically stable  Antibiotics:  Zosyn 02/15/2013>>>  Vancomycin 02/15/2013>>>    Procedures/Studies: Dg Chest 2 View  02/15/2013   CLINICAL DATA:  Fall. Left shoulder pain. History of asthma and lung cancer.  EXAM: CHEST  2 VIEW  COMPARISON:  None.  FINDINGS: Peripherally density in the right upper lobe. This could reflect scarring although a pulmonary nodule cannot be excluded. Compares and old study if any outside studies are available would be helpful. Mild hyperinflation of the lungs. Heart is normal size. No additional focal airspace opacity. No effusion or acute bony abnormality.  IMPRESSION: Peripheral density in the  right upper lobe. This may reflect scarring, but cannot exclude pulmonary nodule. Comparison to any outside studies would be helpful. If none are available, chest CT would be recommended.   Electronically Signed   By: Charlett Nose M.D.   On: 02/15/2013 11:08   Dg Pelvis 1-2 Views  02/15/2013   CLINICAL DATA:  Fall.  EXAM: PELVIS - 1-2 VIEW  COMPARISON:  None.  FINDINGS: Early degenerative changes in the hips bilaterally. No acute bony abnormality. Specifically, no fracture, subluxation, or dislocation. Soft tissues are intact. Posterior hardware noted in the lower lumbar spine.  IMPRESSION: No acute findings.   Electronically Signed   By: Charlett Nose M.D.   On: 02/15/2013 11:11   Ct Head Wo Contrast  02/15/2013   CLINICAL DATA:  Fall.  Initial encounter.  EXAM: CT HEAD WITHOUT CONTRAST  CT CERVICAL SPINE WITHOUT CONTRAST  TECHNIQUE: Multidetector CT imaging of the head and cervical spine was performed following the standard protocol without intravenous contrast. Multiplanar CT image reconstructions of the cervical spine were also generated.  COMPARISON:  None.  FINDINGS: CT HEAD FINDINGS  Mild atrophy and diffuse white matter changes are evident. A lacunar infarct is noted within the left caudate head. This appears remote with ex vacuo dilation of the adjacent ventricle. No acute cortical infarct, hemorrhage, or mass lesion is present. The ventricles are proportionate to the degree of atrophy. No significant extra-axial fluid collection is present.  No significant extracranial injury is evident. And the paranasal sinuses and the mastoid air cells are clear. The osseous skull is intact.  CT CERVICAL SPINE FINDINGS  The cervical spine is imaged from the skull base through T1-2. Degenerative anterolisthesis is evident at C4-5 and to a lesser extent  at C5-6. No acute bone or soft tissue abnormalities are present. Multilevel uncovertebral spurring is noted. Osseous foraminal narrowing is noted on the left at C3-4.   The soft tissues of the neck demonstrate mild lobulation of the thyroid without a discrete lesion. The lung apices are clear.  IMPRESSION: CT HEAD IMPRESSION  1. Mild generalized atrophy and white matter disease. This likely reflects the sequela of chronic microvascular ischemia. 2. A lacunar infarct of the left caudate head appears remote. 3. No acute intracranial abnormality.  CT CERVICAL SPINE IMPRESSION  1. Multilevel spondylosis of the cervical spine with left osseous foraminal narrowing at C3-4. 2. No acute abnormality.   Electronically Signed   By: Gennette Pac   On: 02/15/2013 10:47   Ct Cervical Spine Wo Contrast  02/15/2013   CLINICAL DATA:  Fall.  Initial encounter.  EXAM: CT HEAD WITHOUT CONTRAST  CT CERVICAL SPINE WITHOUT CONTRAST  TECHNIQUE: Multidetector CT imaging of the head and cervical spine was performed following the standard protocol without intravenous contrast. Multiplanar CT image reconstructions of the cervical spine were also generated.  COMPARISON:  None.  FINDINGS: CT HEAD FINDINGS  Mild atrophy and diffuse white matter changes are evident. A lacunar infarct is noted within the left caudate head. This appears remote with ex vacuo dilation of the adjacent ventricle. No acute cortical infarct, hemorrhage, or mass lesion is present. The ventricles are proportionate to the degree of atrophy. No significant extra-axial fluid collection is present.  No significant extracranial injury is evident. And the paranasal sinuses and the mastoid air cells are clear. The osseous skull is intact.  CT CERVICAL SPINE FINDINGS  The cervical spine is imaged from the skull base through T1-2. Degenerative anterolisthesis is evident at C4-5 and to a lesser extent at C5-6. No acute bone or soft tissue abnormalities are present. Multilevel uncovertebral spurring is noted. Osseous foraminal narrowing is noted on the left at C3-4.  The soft tissues of the neck demonstrate mild lobulation of the thyroid  without a discrete lesion. The lung apices are clear.  IMPRESSION: CT HEAD IMPRESSION  1. Mild generalized atrophy and white matter disease. This likely reflects the sequela of chronic microvascular ischemia. 2. A lacunar infarct of the left caudate head appears remote. 3. No acute intracranial abnormality.  CT CERVICAL SPINE IMPRESSION  1. Multilevel spondylosis of the cervical spine with left osseous foraminal narrowing at C3-4. 2. No acute abnormality.   Electronically Signed   By: Gennette Pac   On: 02/15/2013 10:47   Ct Abdomen Pelvis W Contrast  02/15/2013   CLINICAL DATA:  Fall. Right lower quadrant pain. Elevated white blood cell count.  EXAM: CT ABDOMEN AND PELVIS WITH CONTRAST  TECHNIQUE: Multidetector CT imaging of the abdomen and pelvis was performed using the standard protocol following bolus administration of intravenous contrast.  CONTRAST:  50mL OMNIPAQUE IOHEXOL 300 MG/ML SOLN, OMNIPAQUE IOHEXOL 300 MG/ML SOLN  COMPARISON:  None.  FINDINGS: Diffuse hepatic steatosis.  Gallbladder, spleen, pancreas, adrenal glands are within normal limits.  Chronic changes of both kidneys are present. Dystrophic calcifications in the right kidney have a chronic appearance. Simple cyst in the left kidney. Tiny cyst in lower pole of the right kidney.  Normal appendix. No evidence of wall thickening of the ascending colon or cecum.  The bladder is markedly distended.  Uterus is absent. The adnexa are unremarkable.  Postoperative changes in the lumbar spine with posterior L4-5 interspinous fusion hardware. Five small hiatal hernia.  No  free-fluid.  IMPRESSION: Marked distention of the bladder. Decompression made alleviate the patient's symptoms.  Normal appendix.   Electronically Signed   By: Maryclare Bean M.D.   On: 02/15/2013 14:28   Dg Shoulder Left  02/15/2013   CLINICAL DATA:  Fall, left shoulder pain.  EXAM: LEFT SHOULDER - 2+ VIEW  COMPARISON:  None.  FINDINGS: Calcification noted at the rotator cuff  insertion compatible with chronic tendonitis. Degenerative changes in the left AC joint. No fracture, subluxation or dislocation.  IMPRESSION: No acute findings. Chronic changes as above.   Electronically Signed   By: Charlett Nose M.D.   On: 02/15/2013 11:11         Subjective: Patient is pleasantly confused but much more lucid this morning. Denies any headache, dizziness, extremity weakness, chest discomfort, shortness breath, nausea, vomiting, abdominal pain, diarrhea.  Objective: Filed Vitals:   02/15/13 1131 02/15/13 1445 02/15/13 2021 02/16/13 0534  BP:  125/49 131/49 116/49  Pulse:  100 78 69  Temp: 97.9 F (36.6 C) 98.5 F (36.9 C) 98.1 F (36.7 C) 97.9 F (36.6 C)  TempSrc: Rectal  Oral Oral  Resp:  18 18 16   Height:  5\' 3"  (1.6 m)    Weight:  50.1 kg (110 lb 7.2 oz)    SpO2:  100% 98% 100%    Intake/Output Summary (Last 24 hours) at 02/16/13 0723 Last data filed at 02/16/13 0647  Gross per 24 hour  Intake   1375 ml  Output   1800 ml  Net   -425 ml   Weight change:  Exam:   General:  Pt is alert, follows commands appropriately, not in acute distress  HEENT: No icterus, No thrush, No meningismus, [place]/AT  Cardiovascular: RRR, S1/S2, no rubs, no gallops  Respiratory: CTA bilaterally, no wheezing, no crackles, no rhonchi  Abdomen: Soft/+BS, non tender, non distended, no guarding  Extremities: No edema, No lymphangitis, No petechiae, No rashes, no synovitis  Data Reviewed: Basic Metabolic Panel:  Recent Labs Lab 02/15/13 1005 02/15/13 1124 02/15/13 1505 02/15/13 1506 02/16/13 0447  NA 121* 122* 126*  --  137  K 2.9* 2.6* 2.8*  --  2.9*  CL 78* 82* 84*  --  97  CO2 21  --  30  --  31  GLUCOSE 146* 125* 112*  --  88  BUN 9 7 7   --  6  CREATININE 0.79 0.90 0.79  --  0.90  CALCIUM 9.8  --  9.5  --  9.2  MG  --   --   --  1.7  --    Liver Function Tests:  Recent Labs Lab 02/15/13 1005  AST 19  ALT 10  ALKPHOS 58  BILITOT 1.0  PROT 6.4   ALBUMIN 3.7   No results found for this basename: LIPASE, AMYLASE,  in the last 168 hours No results found for this basename: AMMONIA,  in the last 168 hours CBC:  Recent Labs Lab 02/15/13 1005 02/15/13 1124 02/16/13 0447  WBC 21.5*  --  11.6*  NEUTROABS 19.0*  --   --   HGB 12.3 12.2 11.5*  HCT 35.0* 36.0 33.5*  MCV 81.2  --  83.3  PLT 331  --  281   Cardiac Enzymes:  Recent Labs Lab 02/15/13 1005 02/15/13 1505 02/15/13 2012  CKTOTAL 228*  --   --   TROPONINI  --  <0.30 <0.30   BNP: No components found with this basename: POCBNP,  CBG:  Recent  Labs Lab 02/15/13 0931  GLUCAP 151*    Recent Results (from the past 240 hour(s))  MRSA PCR SCREENING     Status: None   Collection Time    02/15/13  2:52 PM      Result Value Range Status   MRSA by PCR NEGATIVE  NEGATIVE Final   Comment:            The GeneXpert MRSA Assay (FDA     approved for NASAL specimens     only), is one component of a     comprehensive MRSA colonization     surveillance program. It is not     intended to diagnose MRSA     infection nor to guide or     monitor treatment for     MRSA infections.     Scheduled Meds: . clonazePAM  0.5 mg Oral BID  . enoxaparin (LOVENOX) injection  40 mg Subcutaneous Q24H  . estradiol  0.5 mg Oral Daily  . feeding supplement  237 mL Oral BID BM  . gabapentin  100 mg Oral QHS  . lidocaine  1 patch Transdermal Q24H  . omega-3 acid ethyl esters  1 g Oral Daily  . pantoprazole  40 mg Oral Daily  . piperacillin-tazobactam  3.375 g Intravenous Q8H  . sertraline  12.5 mg Oral q morning - 10a  . sodium chloride  3 mL Intravenous Q12H  . vancomycin  500 mg Intravenous Q12H   Continuous Infusions:    Lora Glomski, DO  [facility] Pager [phone]  If 7PM-7AM, please contact night-coverage [email or web address] Password [email or web address] 02/16/2013, 7:23 AM   LOS: 1 day

## 2013-02-16 NOTE — Procedures (Signed)
ELECTROENCEPHALOGRAM REPORT   Patient: Jeanne Compton       Room #: 1401 EEG No. ID: [id number] Age: 79 y.o.        Sex: female Referring Physician: D. Tat Report Date:  02/16/2013        Interpreting Physician: Aline Brochure  History: [name] is an 79 y.o. female with a history of dementia who was brought to the hospital for evaluation for increased confusion associated with an unwitnessed fall at her living facility. Patient was also incontinent of stool.  Indications for study:  Rule out an encephalopathic process; rule out seizure disorder.  Technique: This is an 18 channel routine scalp EEG performed at the bedside with bipolar and monopolar montages arranged in accordance to the international 10/20 system of electrode placement.   Description: This EEG was performed during wakefulness and during sleep. Predominant background activity during wakefulness consisted of 9-10 Hz symmetrical alpha rhythm which attenuates well with eye opening. Photic stimulation was not performed. Hyperventilation was not performed. During sleep there was slowing of background activity with mixed irregular delta and theta activity symmetrically, as well as normal sleep spindles, vertex waves and arousal responses. No epileptiform discharges recorded. There was no abnormal slowing.  Interpretation: This is a normal EEG recording during wakefulness and during sleep.   Venetia Maxon M.D. [facility] [phone]

## 2013-02-16 NOTE — Progress Notes (Signed)
EEG Completed; Results Pending  

## 2013-02-16 NOTE — Clinical Social Work Psychosocial (Signed)
     Clinical Social Work Department BRIEF PSYCHOSOCIAL ASSESSMENT 02/16/2013  Patient:  Jeanne Compton, Jeanne Compton     Account Number:  [id number]     Admit date:  02/15/2013  Clinical Social Worker:  Hattie Perch  Date/Time:  02/16/2013 12:00 M  Referred by:  Physician  Date Referred:  02/16/2013 Referred for  ALF Placement   Other Referral:   Interview type:  Family Other interview type:    PSYCHOSOCIAL DATA Living Status:  FACILITY Admitted from facility:  [facility] Level of care:  Assisted Living Primary support name:  Delphina Cahill Primary support relationship to patient:  CHILD, ADULT Degree of support available:   good    CURRENT CONCERNS Current Concerns  Post-Acute Placement   Other Concerns:    SOCIAL WORK ASSESSMENT / PLAN CSW met with patient. patient is confused and is unable to participate in assessment. CSW called patient's daughter, Darl Pikes. She confirms that patient just moved here from [place] after patient's spouse died in 01/31/2023. She would like patient to return to [facility] upon discharge as patient has just started getting used to living there after three weeks.   Assessment/plan status:   Other assessment/ plan:   Information/referral to community resources:    PATIENTS/FAMILYS RESPONSE TO PLAN OF CARE: daughter is happy with the care she is recieving at [facility] and would like patient to return there upon discharge.

## 2013-02-16 NOTE — Progress Notes (Signed)
Echocardiogram 2D Echocardiogram has been performed.  Jeanne Compton 02/16/2013, 2:27 PM

## 2013-02-17 LAB — BASIC METABOLIC PANEL
BUN: 7 mg/dL (ref 6–23)
Chloride: 106 mEq/L (ref 96–112)
GFR calc Af Amer: 60 mL/min — ABNORMAL LOW (ref >90)
Potassium: 3.7 mEq/L (ref 3.5–5.1)

## 2013-02-17 LAB — CBC
HCT: 30.5 % — ABNORMAL LOW (ref 36.0–46.0)
Hemoglobin: 10.1 g/dL — ABNORMAL LOW (ref 12.0–15.0)
MCHC: 33.1 g/dL (ref 30.0–36.0)
WBC: 9.2 10*3/uL (ref 4.0–10.5)

## 2013-02-17 MED ORDER — HYDROCODONE-ACETAMINOPHEN 5-325 MG PO TABS: 1.0000 | ORAL_TABLET | ORAL | Status: DC | PRN

## 2013-02-17 MED ORDER — CLONAZEPAM 0.5 MG PO TBDP: 0.5000 mg | ORAL_TABLET | Freq: Two times a day (BID) | ORAL | Status: DC

## 2013-02-17 NOTE — Progress Notes (Signed)
Patient cleared for discharge. Packet copied and placed in [facility]. CSW called patient's daughter, she is so happy that patient is ready to return. She will transport patient.  [name] C. [name] MSW, LCSW [phone]

## 2013-02-17 NOTE — Evaluation (Signed)
Physical Therapy Evaluation Patient Details Name: Jeanne Compton MRN: [id number] DOB: 1932/03/25 Today's Date: 02/17/2013 Time: 4098-1191 PT Time Calculation (min): 24 min  PT Assessment / Plan / Recommendation History of Present Illness  79 year old female with a history of remote lung cancer, dementia, depression, chronic back pain, and hypertension presents from [facility] on her daughter found her on the floor after a presumed unwitnessed fall. The patient was awake but confused.   Clinical Impression  Pt appears deconditioned and has complaints of pain and dizziness that I expect should improve as she increases activity.  Pt was able to walk to the bathroom and out into the hall, but was self limiting. She will need increased care initially at ALF    PT Assessment  Patient needs continued PT services    Follow Up Recommendations  Home health PT;Other (comment) (HHOT)    Does the patient have the potential to tolerate intense rehabilitation      Barriers to Discharge   pt with need increased supervison from ALF initially    Equipment Recommendations  Rolling walker with 5" wheels    Recommendations for Other Services OT consult   Frequency Min 3X/week    Precautions / Restrictions Precautions Precautions: Fall   Pertinent Vitals/Pain Sitting BP 96/45, Standing BP 109/52      Mobility  Bed Mobility Bed Mobility: Supine to Sit Supine to Sit: 4: Min assist Transfers Transfers: Sit to Stand;Stand to Sit Sit to Stand: 4: Min assist Stand to Sit: 4: Min assist Details for Transfer Assistance: Pt appears distressed that she is weak. "i can't do it", but with encouragement and min assist she is able to stand Ambulation/Gait Ambulation/Gait Assistance: 4: Min assist Ambulation Distance (Feet): 40 Feet Assistive device: Rolling walker Ambulation/Gait Assistance Details: encouragement to continue Gait Pattern: Step-through pattern;Trunk flexed Gait velocity:  decreased General Gait Details: pt c/o back and leg pain, dizziness and this limits her walking. Stairs: No Wheelchair Mobility Wheelchair Mobility: No    Exercises General Exercises - Lower Extremity Ankle Circles/Pumps: AROM;Both;5 reps Long Arc Quad: AROM;Both;5 reps;Seated Hip Flexion/Marching: AROM;Both;5 reps;Supine   PT Diagnosis: Difficulty walking;Generalized weakness;Abnormality of gait;Acute pain  PT Problem List: Decreased strength;Decreased activity tolerance;Decreased balance;Decreased mobility;Decreased safety awareness;Decreased cognition PT Treatment Interventions: Gait training;DME instruction;Functional mobility training;Therapeutic activities;Therapeutic exercise;Balance training;Cognitive remediation     PT Goals(Current goals can be found in the care plan section) Acute Rehab PT Goals Patient Stated Goal: to get stronger PT Goal Formulation: With patient Time For Goal Achievement: 03/03/13 Potential to Achieve Goals: Good  Visit Information  Last PT Received On: 02/17/13 Assistance Needed: +1 History of Present Illness: 79 year old female with a history of remote lung cancer, dementia, depression, chronic back pain, and hypertension presents from [facility] on her daughter found her on the floor after a presumed unwitnessed fall. The patient was awake but confused.        Prior Functioning  Home Living Family/patient expects to be discharged to:: Assisted living Home Equipment: None Prior Function Level of Independence: Independent Communication Communication: No difficulties    Cognition  Cognition Arousal/Alertness: Awake/alert Behavior During Therapy: WFL for tasks assessed/performed;Anxious Overall Cognitive Status: No family/caregiver present to determine baseline cognitive functioning Memory: Decreased short-term memory    Extremity/Trunk Assessment Lower Extremity Assessment Lower Extremity Assessment: Overall WFL for tasks  assessed;Generalized weakness (eccymosis on left lat leg near fibular head) Cervical / Trunk Assessment Cervical / Trunk Assessment: Normal   Balance Balance Balance Assessed: Yes Static Sitting Balance Static  Sitting - Balance Support: No upper extremity supported;Feet unsupported Static Sitting - Comment/# of Minutes: pt able to sit on edge of bed several minutes to take pills, comb hair, and prepare for walking with no balance defecit noted Static Standing Balance Static Standing - Balance Support: Bilateral upper extremity supported Static Standing - Level of Assistance: 5: Stand by assistance Static Standing - Comment/# of Minutes: pt limited by dizziness in standing,  but no orthostatic hypotension noted  End of Session PT - End of Session Activity Tolerance: Patient limited by pain;Patient limited by fatigue Patient left: in chair;with chair alarm set;with call bell/phone within reach Nurse Communication: Mobility status  GP    Rosey Bath K. Manson Passey, PT [phone] 02/17/2013, 10:18 AM

## 2013-02-21 LAB — CULTURE, BLOOD (ROUTINE X 2): Culture: NO GROWTH

## 2013-02-22 NOTE — ED Provider Notes (Signed)
Medical screening examination/treatment/procedure(s) were conducted as a shared visit with non-physician practitioner(s) and myself.  I personally evaluated the patient during the encounter  Patient seen examined. With a history of dementia her history is minimal reliable. She was on the floor. She just had a bowel movement. Initially had some tenderness. Her bladder was drained she seemed more comfortable. Ultimately CT was obtained. Has a lactic acidosis. Ultimately pressures were made for admission.  Roney Marion, MD 02/22/13 [phone]

## 2016-01-10 ENCOUNTER — Emergency Department (HOSPITAL_COMMUNITY): Payer: Medicare Other

## 2016-01-10 ENCOUNTER — Observation Stay (HOSPITAL_COMMUNITY)
Admission: EM | Admit: 2016-01-10 | Discharge: 2016-01-13 | Disposition: A | Discharge status: Skilled Nursing Facility | Payer: Medicare Other | Attending: Family Medicine | Admitting: Family Medicine

## 2016-01-10 ENCOUNTER — Encounter (HOSPITAL_COMMUNITY): Payer: Self-pay

## 2016-01-10 ENCOUNTER — Inpatient Hospital Stay (HOSPITAL_COMMUNITY): Payer: Medicare Other

## 2016-01-10 DIAGNOSIS — Z7982 Long term (current) use of aspirin: Secondary | ICD-10-CM | POA: Insufficient documentation

## 2016-01-10 DIAGNOSIS — K760 Fatty (change of) liver, not elsewhere classified: Secondary | ICD-10-CM | POA: Diagnosis not present

## 2016-01-10 DIAGNOSIS — F419 Anxiety disorder, unspecified: Secondary | ICD-10-CM | POA: Diagnosis not present

## 2016-01-10 DIAGNOSIS — Z85118 Personal history of other malignant neoplasm of bronchus and lung: Secondary | ICD-10-CM | POA: Insufficient documentation

## 2016-01-10 DIAGNOSIS — I10 Essential (primary) hypertension: Secondary | ICD-10-CM

## 2016-01-10 DIAGNOSIS — J45909 Unspecified asthma, uncomplicated: Secondary | ICD-10-CM | POA: Diagnosis not present

## 2016-01-10 DIAGNOSIS — F0390 Unspecified dementia without behavioral disturbance: Secondary | ICD-10-CM

## 2016-01-10 DIAGNOSIS — Z79899 Other long term (current) drug therapy: Secondary | ICD-10-CM | POA: Insufficient documentation

## 2016-01-10 DIAGNOSIS — N390 Urinary tract infection, site not specified: Secondary | ICD-10-CM | POA: Insufficient documentation

## 2016-01-10 DIAGNOSIS — R29810 Facial weakness: Principal | ICD-10-CM | POA: Diagnosis present

## 2016-01-10 DIAGNOSIS — M545 Low back pain: Secondary | ICD-10-CM | POA: Insufficient documentation

## 2016-01-10 DIAGNOSIS — IMO0002 Reserved for concepts with insufficient information to code with codable children: Secondary | ICD-10-CM

## 2016-01-10 DIAGNOSIS — Z8673 Personal history of transient ischemic attack (TIA), and cerebral infarction without residual deficits: Secondary | ICD-10-CM | POA: Diagnosis not present

## 2016-01-10 DIAGNOSIS — G8929 Other chronic pain: Secondary | ICD-10-CM | POA: Insufficient documentation

## 2016-01-10 DIAGNOSIS — R531 Weakness: Secondary | ICD-10-CM | POA: Insufficient documentation

## 2016-01-10 DIAGNOSIS — Z923 Personal history of irradiation: Secondary | ICD-10-CM | POA: Diagnosis not present

## 2016-01-10 DIAGNOSIS — R269 Unspecified abnormalities of gait and mobility: Secondary | ICD-10-CM | POA: Insufficient documentation

## 2016-01-10 DIAGNOSIS — D72829 Elevated white blood cell count, unspecified: Secondary | ICD-10-CM | POA: Insufficient documentation

## 2016-01-10 DIAGNOSIS — I6340 Cerebral infarction due to embolism of unspecified cerebral artery: Secondary | ICD-10-CM | POA: Diagnosis not present

## 2016-01-10 DIAGNOSIS — R229 Localized swelling, mass and lump, unspecified: Secondary | ICD-10-CM

## 2016-01-10 DIAGNOSIS — R451 Restlessness and agitation: Secondary | ICD-10-CM | POA: Diagnosis not present

## 2016-01-10 DIAGNOSIS — R4781 Slurred speech: Secondary | ICD-10-CM

## 2016-01-10 LAB — COMPREHENSIVE METABOLIC PANEL
ALBUMIN: 3.7 g/dL (ref 3.5–5.0)
ALT: 19 U/L (ref 14–54)
ANION GAP: 9 (ref 5–15)
AST: 21 U/L (ref 15–41)
Alkaline Phosphatase: 68 U/L (ref 38–126)
BUN: 27 mg/dL — ABNORMAL HIGH (ref 6–20)
CALCIUM: 9.6 mg/dL (ref 8.9–10.3)
CHLORIDE: 106 mmol/L (ref 101–111)
CO2: 23 mmol/L (ref 22–32)
Creatinine, Ser: 1.22 mg/dL — ABNORMAL HIGH (ref 0.44–1.00)
GFR calc non Af Amer: 40 mL/min — ABNORMAL LOW (ref >60)
GFR, EST AFRICAN AMERICAN: 46 mL/min — AB (ref >60)
Glucose, Bld: 98 mg/dL (ref 65–99)
POTASSIUM: 4.4 mmol/L (ref 3.5–5.1)
SODIUM: 138 mmol/L (ref 135–145)
Total Bilirubin: 0.3 mg/dL (ref 0.3–1.2)
Total Protein: 6.1 g/dL — ABNORMAL LOW (ref 6.5–8.1)

## 2016-01-10 LAB — RAPID URINE DRUG SCREEN, HOSP PERFORMED
Amphetamines: NOT DETECTED
BARBITURATES: NOT DETECTED
BENZODIAZEPINES: NOT DETECTED
COCAINE: NOT DETECTED
OPIATES: NOT DETECTED
TETRAHYDROCANNABINOL: NOT DETECTED

## 2016-01-10 LAB — DIFFERENTIAL
BASOS PCT: 0 %
Basophils Absolute: 0 10*3/uL (ref 0.0–0.1)
EOS ABS: 0.1 10*3/uL (ref 0.0–0.7)
EOS PCT: 1 %
Lymphocytes Relative: 20 %
Lymphs Abs: 2.7 10*3/uL (ref 0.7–4.0)
MONO ABS: 0.9 10*3/uL (ref 0.1–1.0)
Monocytes Relative: 6 %
NEUTROS PCT: 73 %
Neutro Abs: 10 10*3/uL — ABNORMAL HIGH (ref 1.7–7.7)

## 2016-01-10 LAB — URINE MICROSCOPIC-ADD ON: RBC / HPF: NONE SEEN RBC/hpf (ref 0–5)

## 2016-01-10 LAB — CBC
HCT: 39.6 % (ref 36.0–46.0)
Hemoglobin: 12 g/dL (ref 12.0–15.0)
MCH: 24.7 pg — AB (ref 26.0–34.0)
MCHC: 30.3 g/dL (ref 30.0–36.0)
MCV: 81.6 fL (ref 78.0–100.0)
PLATELETS: 229 10*3/uL (ref 150–400)
RBC: 4.85 MIL/uL (ref 3.87–5.11)
RDW: 14.9 % (ref 11.5–15.5)
WBC: 13.7 10*3/uL — ABNORMAL HIGH (ref 4.0–10.5)

## 2016-01-10 LAB — URINALYSIS, ROUTINE W REFLEX MICROSCOPIC
BILIRUBIN URINE: NEGATIVE
Glucose, UA: NEGATIVE mg/dL
HGB URINE DIPSTICK: NEGATIVE
KETONES UR: NEGATIVE mg/dL
Nitrite: NEGATIVE
PROTEIN: NEGATIVE mg/dL
Specific Gravity, Urine: 1.021 (ref 1.005–1.030)
pH: 5 (ref 5.0–8.0)

## 2016-01-10 LAB — I-STAT TROPONIN, ED: Troponin i, poc: 0 ng/mL (ref 0.00–0.08)

## 2016-01-10 LAB — I-STAT CHEM 8, ED
BUN: 35 mg/dL — AB (ref 6–20)
CALCIUM ION: 1.17 mmol/L (ref 1.12–1.23)
CHLORIDE: 106 mmol/L (ref 101–111)
Creatinine, Ser: 1.2 mg/dL — ABNORMAL HIGH (ref 0.44–1.00)
Glucose, Bld: 94 mg/dL (ref 65–99)
HEMATOCRIT: 38 % (ref 36.0–46.0)
Hemoglobin: 12.9 g/dL (ref 12.0–15.0)
Potassium: 4.4 mmol/L (ref 3.5–5.1)
SODIUM: 140 mmol/L (ref 135–145)
TCO2: 26 mmol/L (ref 0–100)

## 2016-01-10 LAB — ETHANOL

## 2016-01-10 LAB — APTT: aPTT: 27 seconds (ref 24–36)

## 2016-01-10 LAB — PROTIME-INR
INR: 1.03
PROTHROMBIN TIME: 13.5 s (ref 11.4–15.2)

## 2016-01-10 MED ORDER — ACETAMINOPHEN 325 MG PO TABS
650.0000 mg | ORAL_TABLET | Freq: Four times a day (QID) | ORAL | Status: DC | PRN
  Administered 2016-01-10 – 2016-01-12 (×4): 650 mg via ORAL
  Filled 2016-01-10 (×4): qty 2

## 2016-01-10 MED ORDER — CLONAZEPAM 0.125 MG PO TBDP
0.1250 mg | ORAL_TABLET | Freq: Two times a day (BID) | ORAL | Status: DC
  Administered 2016-01-10 – 2016-01-13 (×6): 0.125 mg via ORAL
  Filled 2016-01-10 (×6): qty 1

## 2016-01-10 MED ORDER — GABAPENTIN 100 MG PO CAPS
100.0000 mg | ORAL_CAPSULE | Freq: Every day | ORAL | Status: DC
  Administered 2016-01-10 – 2016-01-12 (×3): 100 mg via ORAL
  Filled 2016-01-10 (×3): qty 1

## 2016-01-10 NOTE — ED Notes (Signed)
Patient transported to MRI 

## 2016-01-10 NOTE — ED Notes (Signed)
Patient returned from CT

## 2016-01-10 NOTE — ED Triage Notes (Signed)
Patient presents to er from [facility] senior [facility] for slurred speech and droop on the left side, the patient was last seen normal at 2100 yesterday, the daughter called to talk with her this morning on the phone and noticed her speech was slurred so she called 911 and sent her here, the patient does have dementia and is at her baseline

## 2016-01-10 NOTE — ED Notes (Signed)
Patient transported to CT 

## 2016-01-10 NOTE — ED Provider Notes (Signed)
[facility] DEPT Provider Note   CSN: [id number] Arrival date & time: 01/10/16  1502  First Provider Contact:  First MD Initiated Contact with Patient 01/10/16 1503        History   Chief Complaint Chief Complaint  Patient presents with  . Aphasia    HPI Jeanne Compton is a 82 y.o. female.  The history is provided by the patient and the EMS personnel.   Brought in by EMS after daughter noted that the patient was slurring her speech this afternoon. Last time the daughter saw the patient normal was 9:00 last night. Patient lives in a skilled nursing facility due to dementia. Upon her daughter's arrival she noted that the patient had facial droop. When the vascular system facility with fast they were unaware of the facial droop and reported that also that the last time they saw her normal was 9:00 last night. She has a history of prior TIAs but no prior strokes. She denied any cardiac history. Patient denied any recent fevers, runny nose, cough, congestion, headache, chest pain, shortness of breath, nausea, vomiting, abdominal pain. No extremity weakness. No interventions tried. No other associated symptoms. No alleviating or aggravating factors. No other physical complaints.  Past Medical History:  Diagnosis Date  . Azotemia   . Dementia   . Hx of breast reduction, elective   . Lung cancer    hx of radiation    Patient Active Problem List   Diagnosis Date Noted  . Acute encephalopathy 02/15/2013  . Hyponatremia 02/15/2013  . Hypokalemia 02/15/2013  . Lactic acidosis 02/15/2013  . RBBB 02/15/2013    Past Surgical History:  Procedure Laterality Date  . ABDOMINAL HYSTERECTOMY    . BACK SURGERY    . BREAST REDUCTION SURGERY    . CARPAL TUNNEL RELEASE    . Ct Biopsy of the lung      OB History    No data available       Home Medications    Prior to Admission medications   Medication Sig Start Date End Date Taking? Authorizing Provider  clonazePAM (KLONOPIN) 0.5  MG disintegrating tablet Take 1 tablet (0.5 mg total) by mouth 2 (two) times daily. 02/17/13   Orson Eva, MD  ENSURE PLUS (ENSURE PLUS) LIQD Take 1 Can by mouth 2 (two) times daily between meals.    Historical Provider, MD  estradiol (ESTRACE) 0.5 MG tablet Take 0.5 mg by mouth daily.    Historical Provider, MD  fish oil-omega-3 fatty acids 1000 MG capsule Take 1 g by mouth daily.    Historical Provider, MD  gabapentin (NEURONTIN) 100 MG capsule Take 100 mg by mouth at bedtime. For pain    Historical Provider, MD  HYDROcodone-acetaminophen (NORCO/VICODIN) 5-325 MG per tablet Take 1 tablet by mouth 2 (two) times daily.    Historical Provider, MD  HYDROcodone-acetaminophen (NORCO/VICODIN) 5-325 MG per tablet Take 1 tablet by mouth every 4 (four) hours as needed. 02/17/13   Orson Eva, MD  lidocaine (LIDODERM) 5 % Place 1 patch onto the skin daily. Remove & Discard patch within 12 hours or as directed by MD. Apply to lower back    Historical Provider, MD  ondansetron (ZOFRAN-ODT) 8 MG disintegrating tablet Take 8 mg by mouth every 8 (eight) hours as needed for nausea (and vomiting).    Historical Provider, MD  pantoprazole (PROTONIX) 40 MG tablet Take 40 mg by mouth daily before breakfast.    Historical Provider, MD  sertraline (ZOLOFT) 25 MG tablet Take  12.5 mg by mouth every morning. anxiety    Historical Provider, MD    Family History No family history on file.  Social History Social History  Substance Use Topics  . Smoking status: Never Smoker  . Smokeless tobacco: Never Used  . Alcohol use No     Allergies   Compazine [prochlorperazine]   Review of Systems Review of Systems  Constitutional: Negative for appetite change, chills, fatigue and fever.  HENT: Negative for congestion, ear pain, facial swelling, mouth sores and sore throat.   Eyes: Negative for visual disturbance.  Respiratory: Negative for cough, chest tightness and shortness of breath.   Cardiovascular: Negative for chest  pain and palpitations.  Gastrointestinal: Negative for abdominal pain, blood in stool, diarrhea, nausea and vomiting.  Endocrine: Negative for cold intolerance and heat intolerance.  Genitourinary: Negative for decreased urine volume, difficulty urinating and frequency.  Musculoskeletal: Negative for back pain and neck stiffness.  Skin: Negative for rash.  Neurological: Positive for facial asymmetry and speech difficulty. Negative for dizziness, weakness, light-headedness and headaches.     Physical Exam Updated Vital Signs There were no vitals taken for this visit.  Physical Exam  Constitutional: She is oriented to person, place, and time. She appears well-developed and well-nourished. No distress.  HENT:  Head: Normocephalic and atraumatic.  Right Ear: External ear normal.  Left Ear: External ear normal.  Nose: Nose normal.  Eyes: Conjunctivae and EOM are normal. Pupils are equal, round, and reactive to light. Right eye exhibits no discharge. Left eye exhibits no discharge. No scleral icterus.  Neck: Normal range of motion. Neck supple.  Cardiovascular: Normal rate, regular rhythm and normal heart sounds.  Exam reveals no gallop and no friction rub.   No murmur heard. Pulmonary/Chest: Effort normal and breath sounds normal. No stridor. No respiratory distress. She has no wheezes.  Abdominal: Soft. She exhibits no distension. There is no tenderness.  Musculoskeletal: She exhibits no edema or tenderness.  Neurological: She is alert and oriented to person, place, and time.  Mental Status: Alert and oriented to person, place, and time. Attention and concentration normal. Speech clear. Recent memory is impaired.  Cranial Nerves  II Visual Fields: Intact to confrontation. Visual fields intact. III, IV, VI: Pupils equal and reactive to light and near. Full eye movement without nystagmus  V Facial Sensation: Normal. No weakness of masticatory muscles  VII: right facial droop with intact  forehead furrowing. VIII Auditory Acuity: Grossly normal  IX/X: The uvula is midline; the palate elevates symmetrically  XI: Normal sternocleidomastoid and trapezius strength  XII: The tongue is midline. No atrophy or fasciculations.   Motor System: Muscle Strength: 5/5 and symmetric in the upper and lower extremities. No pronation or drift.  Muscle Tone: Tone and muscle bulk are normal in the upper and lower extremities.   Reflexes: DTRs: 2+ and symmetrical in all four extremities. Plantar responses are flexor bilaterally.  Coordination: Intact finger-to-nose, heel-to-shin, and rapid alternating movements. No tremor.  Sensation: Intact to light touch, and pinprick.  Gait: deferred   Skin: Skin is warm and dry. No rash noted. She is not diaphoretic. No erythema.  Psychiatric: She has a normal mood and affect.     ED Treatments / Results  Labs (all labs ordered are listed, but only abnormal results are displayed) Labs Reviewed  CBC - Abnormal; Notable for the following:       Result Value   WBC 13.7 (*)    MCH 24.7 (*)  All other components within normal limits  DIFFERENTIAL - Abnormal; Notable for the following:    Neutro Abs 10.0 (*)    All other components within normal limits  COMPREHENSIVE METABOLIC PANEL - Abnormal; Notable for the following:    BUN 27 (*)    Creatinine, Ser 1.22 (*)    Total Protein 6.1 (*)    GFR calc non Af Amer 40 (*)    GFR calc Af Amer 46 (*)    All other components within normal limits  URINALYSIS, ROUTINE W REFLEX MICROSCOPIC (NOT AT [facility]) - Abnormal; Notable for the following:    APPearance HAZY (*)    Leukocytes, UA MODERATE (*)    All other components within normal limits  URINE MICROSCOPIC-ADD ON - Abnormal; Notable for the following:    Squamous Epithelial / LPF 6-30 (*)    Bacteria, UA FEW (*)    All other components within normal limits  I-STAT CHEM 8, ED - Abnormal; Notable for the following:    BUN 35 (*)    Creatinine, Ser 1.20  (*)    All other components within normal limits  ETHANOL  PROTIME-INR  APTT  URINE RAPID DRUG SCREEN, HOSP PERFORMED  I-STAT TROPOININ, ED    EKG  EKG Interpretation  Date/Time:  Friday January 10 2016 15:16:14 EDT Ventricular Rate:  67 PR Interval:    QRS Duration: 114 QT Interval:  416 QTC Calculation: 440 R Axis:   -9 Text Interpretation:  Sinus rhythm Probable left atrial enlargement Incomplete right bundle branch block Low voltage, precordial leads Confirmed by [facility] MD, Maxene Byington ([id number]) on 01/10/2016 6:16:36 PM       Radiology Ct Head Wo Contrast  Result Date: 01/10/2016 CLINICAL DATA:  Slurred speech and facial droop. Last seen normal yesterday. History of lung carcinoma and dementia. EXAM: CT HEAD WITHOUT CONTRAST TECHNIQUE: Contiguous axial images were obtained from the base of the skull through the vertex without intravenous contrast. COMPARISON:  02/15/2013 FINDINGS: The ventricles are normal in configuration. There is ventricular and sulcal enlargement reflecting moderate atrophy. There are no parenchymal masses or mass effect. There is an old lacune infarct in the left caudate nucleus head. There is no evidence of a recent infarct. Patchy areas of white matter hypoattenuation noted consistent with mild chronic microvascular ischemic change. There are no extra-axial masses or abnormal fluid collections. There is no intracranial hemorrhage. The visualized sinuses and mastoid air cells are clear. No skull lesion. IMPRESSION: 1. No acute intracranial abnormalities. 2. Atrophy, old left caudate nucleus lacune infarct and mild chronic microvascular ischemic change. Electronically Signed   By: Lajean Manes M.D.   On: 01/10/2016 15:51    Procedures Procedures (including critical care time)  Medications Ordered in ED Medications - No data to display   Initial Impression / Assessment and Plan / ED Course  I have reviewed the triage vital signs and the nursing  notes.  Pertinent labs & imaging results that were available during my care of the patient were reviewed by me and considered in my medical decision making (see chart for details).  Clinical Course    Right facial droop and slurred speech noted this afternoon by daughter. Last seen normal was yesterday evening at 9:00. She with a history of TIAs and no prior strokes. Patient does have a history of dementia and memory issues living in skilled nursing facility.  Out of the window for code stroke. We will obtain stroke labs and CT head. [facility] consult neurology for  further management.  Neurology assessed the patient in the emergency department and agreed. They requested admission for further stroke workup.  Patient remained hemodynamically stable while in the emergency department.  Final Clinical Impressions(s) / ED Diagnoses   Final diagnoses:  Slurred speech  Facial droop  Dementia, without behavioral disturbance    Disposition: Admit  Condition: stable    Fatima Blank, MD 01/10/16 [facility]

## 2016-01-10 NOTE — H&P (Signed)
[facility] Admission History and Physical Service Pager: [phone]  Patient name: Jeanne Compton Medical record number: [id number] Date of birth: Jun 19, 1931 Age: 82 y.o. Gender: female  Primary Care Provider: MAZZOCCHI, Reggy Eye, MD Consultants: Neurology Code Status: FULL  Chief Complaint: slurred speech and right facial droop  Assessment and Plan: Jeanne Compton is a 82 y.o. female presenting with right sided facial droop and slurred speech. PMH is significant for dementia, history of lung cancer s/p radiation, HTN, chronic back pain, hx of asthma (not requiring medications currently, hx of TIA/CVA, anxiety.   R Facial Droop and Slurred Speech concern for Stroke/TIA: Per daughter, symptoms seemed to have improved slightly from earlier today. Examiner could not discern any neurological deficits. CT negative for acute process but notes of old lacunar infarct . UDS negative. - admit to telemetry, attending Dr. Gwendlyn Deutscher - ASA '81mg'$   - neuro checks per protocol - neurology consulted, appreciate recommendations - MRI, MRA head, Carotid Dopplers, ECHO - Lipid, a1c - permissive HTN - PT/OT; bedside swallow study passed   Leukocytosis: afebrile. No clinical signs of infectious source. UA with moderate leukocytes and with 6-30 squams, 6-30 wbc, and few bacteria which is not impressive. Denies dysuria. Reports of some nasal congestion which daughter reports is chronic but no cough or sob. Lung exam is unremarkable. CXR not obtained in the ED, however I do not think is currently indicated. Afebrile and stable vitals.  - will monitor for now with labs - urine culture added  Elevated BUN: Creatinine seems at baseline possibly slightly elevated. Hemoglobin seems stable. No signs or symptoms of bleeding.  - will monitor for now    HTN: Per ALF, patient is on Lisinopril 2.'5mg'$  daily and Metoprolol ER '25mg'$  daily. BP stable.  - will hold for now to allow permissive HTN    Anxiety:  - Sertraline '25mg'$  daily   - Klonopin 0.'125mg'$  BID (12pm and 8pm)  Chronic Back Pain: Also has Norco 5-325 q12hr PRN which patient has not needed in over a month per ALF, therefore will hold.  - Gabapentin '100mg'$  at 8PM  Dementia: at baseline per daughter   FEN/GI: heart health;SLIV Prophylaxis: Lovenox  Disposition: admit for further evaluation   History of Present Illness:  Jeanne Compton is a 82 y.o. female presenting with slurred speech and right sided facial droop. History provided by daughter.   Daughter called her mother around 7:45AM today and noticed her speech seemed slurred but thought patient just woke up. She called again around 1 pm and continued to note it, so when went to see patient. By the time she arrived at ALF [facility]), the staff also had noticed this symptom along with right sided facial droop. The staff called EMS. Last known normal was last night at 9pm. Daughter feels that symptoms have improved but not quite back to baseline.   Patient has no concerns today. Note of chronic low back pain. Denies fevers/chills. Notes of nasal congestion (chronic per daugher) but no cough or sob, chest pain, or abdominal pain.  Daughter reports that patient has a history of TIA. She is not sure what medications patient is on.   Review Of Systems: Per HPI  Otherwise the remainder of the systems were negative.  Patient Active Problem List   Diagnosis Date Noted  . Facial droop 01/10/2016  . Acute encephalopathy 02/15/2013  . Hyponatremia 02/15/2013  . Hypokalemia 02/15/2013  . Lactic acidosis 02/15/2013  . RBBB 02/15/2013    Past Medical  History: Past Medical History:  Diagnosis Date  . Azotemia   . Dementia   . Hx of breast reduction, elective   . Lung cancer ([place])    hx of radiation    Past Surgical History: Past Surgical History:  Procedure Laterality Date  . ABDOMINAL HYSTERECTOMY    . BACK SURGERY    . BREAST REDUCTION SURGERY    .  CARPAL TUNNEL RELEASE    . Ct Biopsy of the lung      Social History: Social History  Substance Use Topics  . Smoking status: Never Smoker  . Smokeless tobacco: Never Used  . Alcohol use No   Additional social history:  Please also refer to relevant sections of EMR.  Family History: History reviewed. No pertinent family history. No history of stroke History of CAD   Allergies and Medications: Allergies  Allergen Reactions  . Compazine [Prochlorperazine]     Per MAR   No current facility-administered medications on file prior to encounter.    Current Outpatient Prescriptions on File Prior to Encounter  Medication Sig Dispense Refill  . clonazePAM (KLONOPIN) 0.5 MG disintegrating tablet Take 1 tablet (0.5 mg total) by mouth 2 (two) times daily. (Patient taking differently: Take 0.125 mg by mouth 2 (two) times daily. ) 30 tablet 0  . gabapentin (NEURONTIN) 100 MG capsule Take 100 mg by mouth at bedtime. For pain    . HYDROcodone-acetaminophen (NORCO/VICODIN) 5-325 MG per tablet Take 1 tablet by mouth every 4 (four) hours as needed. 30 tablet 0  . sertraline (ZOLOFT) 25 MG tablet Take 25 mg by mouth every morning. anxiety       Objective: BP 157/58 (BP Location: Right Arm)   Pulse 60   Resp 20   SpO2 97%  Exam: GEN: NAD HEENT: Atraumatic, normocephalic, neck supple, EOMI, sclera clear, PERRL  CV: RRR, no murmurs, rubs, or gallops PULM: CTAB, normal effort ABD: Soft, nontender, nondistended, NABS, no organomegaly SKIN: No rash or cyanosis; warm and well-perfused EXTR: No lower extremity edema or calf tenderness PSYCH: Mood and affect euthymic, normal rate and volume of speech NEURO: Awake, alert, oriented to person and place (hospital but not name of hospital). CH 3-12 normal (vidual fields not tested), strength 5/5 in upper and lower extremities, normal sensation to light touch in all extremities, normal speech (to the examiner however daughter reports very slight slurred  speech but improved from earlier today) . Finger to nose test normal.    Labs and Imaging: CBC BMET   Recent Labs Lab 01/10/16 1608 01/10/16 1631  WBC 13.7*  --   HGB 12.0 12.9  HCT 39.6 38.0  PLT 229  --     Recent Labs Lab 01/10/16 1608 01/10/16 1631  NA 138 140  K 4.4 4.4  CL 106 106  CO2 23  --   BUN 27* 35*  CREATININE 1.22* 1.20*  GLUCOSE 98 94  CALCIUM 9.6  --      UDS: neg EtOH: <5 INR 1.03 PT 13.5 istat trop: 0.00 UA: nod leukocytes; 6-30 squams, 6-30 wbc, few bacteria  EKG: NSR, incomplete RBBB (old)   CT head:  IMPRESSION: 1. No acute intracranial abnormalities. 2. Atrophy, old left caudate nucleus lacune infarct and mild chronic microvascular ischemic change.  Smiley Houseman, MD 01/10/2016, 8:29 PM PGY-2, [place] Intern pager: [phone], text pages welcome

## 2016-01-10 NOTE — ED Notes (Signed)
Neuro, PA at bedside.

## 2016-01-10 NOTE — Consult Note (Signed)
Requesting Physician: Dr. Leonette Monarch    Chief Complaint: Stroke    HPI:                                                                                                                                         Jeanne Compton is an 82 y.o. female  With significant dementia (continued to ask the same questions while in room). She was noted to have a facial droop and slurred speech by daughter and staff at the nursing home today. She was brought to ED for further evaluation. Per daughter she was LSN 2100 hours.   Date last known well: Date: 01/09/2016 Time last known well: Time: 21:00 tPA Given: No: out of window  Past Medical History:  Diagnosis Date  . Azotemia   . Dementia   . Hx of breast reduction, elective   . Lung cancer ([place])    hx of radiation    Past Surgical History:  Procedure Laterality Date  . ABDOMINAL HYSTERECTOMY    . BACK SURGERY    . BREAST REDUCTION SURGERY    . CARPAL TUNNEL RELEASE    . Ct Biopsy of the lung      History reviewed. No pertinent family history. Social History:  reports that she has never smoked. She has never used smokeless tobacco. She reports that she does not drink alcohol or use drugs.  Allergies:  Allergies  Allergen Reactions  . Compazine [Prochlorperazine]     Per MAR    Medications:                                                                                                                          No current facility-administered medications for this encounter.    Current Outpatient Prescriptions  Medication Sig Dispense Refill  . clonazePAM (KLONOPIN) 0.5 MG disintegrating tablet Take 1 tablet (0.5 mg total) by mouth 2 (two) times daily. 30 tablet 0  . ENSURE PLUS (ENSURE PLUS) LIQD Take 1 Can by mouth 2 (two) times daily between meals.    Marland Kitchen estradiol (ESTRACE) 0.5 MG tablet Take 0.5 mg by mouth daily.    . fish oil-omega-3 fatty acids 1000 MG capsule Take 1 g by mouth daily.    Marland Kitchen gabapentin (NEURONTIN) 100 MG capsule Take 100  mg by mouth at bedtime. For pain    . HYDROcodone-acetaminophen (NORCO/VICODIN) 5-325 MG per  tablet Take 1 tablet by mouth 2 (two) times daily.    Marland Kitchen HYDROcodone-acetaminophen (NORCO/VICODIN) 5-325 MG per tablet Take 1 tablet by mouth every 4 (four) hours as needed. 30 tablet 0  . lidocaine (LIDODERM) 5 % Place 1 patch onto the skin daily. Remove & Discard patch within 12 hours or as directed by MD. Apply to lower back    . ondansetron (ZOFRAN-ODT) 8 MG disintegrating tablet Take 8 mg by mouth every 8 (eight) hours as needed for nausea (and vomiting).    . pantoprazole (PROTONIX) 40 MG tablet Take 40 mg by mouth daily before breakfast.    . sertraline (ZOLOFT) 25 MG tablet Take 12.5 mg by mouth every morning. anxiety      ROS:                                                                                                                                       History obtained from unobtainable from patient due to dementia  s  Neurologic Examination:                                                                                                      There were no vitals taken for this visit.  HEENT-  Normocephalic, no lesions, without obvious abnormality.  Normal external eye and conjunctiva.  Normal TM's bilaterally.  Normal auditory canals and external ears. Normal external nose, mucus membranes and septum.  Normal pharynx. Cardiovascular- S1, S2 normal, pulses palpable throughout   Lungs- chest clear, no wheezing, rales, normal symmetric air entry Abdomen- normal findings: bowel sounds normal Extremities- no edema Lymph-no adenopathy palpable Musculoskeletal-no joint tenderness, deformity or swelling Skin-warm and dry, no hyperpigmentation, vitiligo, or suspicious lesions  Neurological Examination Mental Status: Alert, not oriented (known dementia), short term memory very impaired.   Speech dysarthric without evidence of aphasia.  Able to follow 3 step commands without  difficulty. Cranial Nerves: II:  Visual fields grossly normal, pupils equal, round, reactive to light and accommodation III,IV, VI: ptosis not present, extra-ocular motions intact bilaterally V,VII: smile symmetric but at rest has a decreased right NLF, facial light touch sensation normal bilaterally VIII: hearing normal bilaterally IX,X: uvula rises symmetrically XI: bilateral shoulder shrug XII: midline tongue extension Motor: Right : Upper extremity   4/5    Left:     Upper extremity   5/5  Lower extremity   5/5     Lower extremity   5/5 --right hand with mild pronation and  weaker tricep extension.  Tone and bulk:normal tone throughout; no atrophy noted Sensory: Pinprick and light touch intact throughout, bilaterally Deep Tendon Reflexes: 2+ and symmetric throughout Plantars: Right: downgoing   Left: downgoing Cerebellar: normal finger-to-nose,  and normal heel-to-shin test Gait: not tessted       Lab Results: Basic Metabolic Panel: No results for input(s): NA, K, CL, CO2, GLUCOSE, BUN, CREATININE, CALCIUM, MG, PHOS in the last 168 hours.  Liver Function Tests: No results for input(s): AST, ALT, ALKPHOS, BILITOT, PROT, ALBUMIN in the last 168 hours. No results for input(s): LIPASE, AMYLASE in the last 168 hours. No results for input(s): AMMONIA in the last 168 hours.  CBC: No results for input(s): WBC, NEUTROABS, HGB, HCT, MCV, PLT in the last 168 hours.  Cardiac Enzymes: No results for input(s): CKTOTAL, CKMB, CKMBINDEX, TROPONINI in the last 168 hours.  Lipid Panel: No results for input(s): CHOL, TRIG, HDL, CHOLHDL, VLDL, LDLCALC in the last 168 hours.  CBG: No results for input(s): GLUCAP in the last 168 hours.  Microbiology: Results for orders placed or performed during the hospital encounter of 02/15/13  Urine culture     Status: None   Collection Time: 02/15/13 10:20 AM  Result Value Ref Range Status   Specimen Description URINE, CATHETERIZED  Final    Special Requests NONE  Final   Culture  Setup Time   Final    02/15/2013 16:19 Performed at [place] Performed at [facility]  Final   Culture NO GROWTH Performed at [facility]  Final   Report Status 02/16/2013 FINAL  Final  Culture, blood (routine x 2)     Status: None   Collection Time: 02/15/13 11:14 AM  Result Value Ref Range Status   Specimen Description BLOOD RIGHT ARM  Final   Special Requests BOTTLES DRAWN AEROBIC ONLY 5CC  Final   Culture  Setup Time   Final    02/15/2013 14:55 Performed at [place]   Final    NO GROWTH 5 DAYS Performed at [facility]   Report Status 02/21/2013 FINAL  Final  Culture, blood (routine x 2)     Status: None   Collection Time: 02/15/13 11:20 AM  Result Value Ref Range Status   Specimen Description BLOOD RIGHT ARM  Final   Special Requests BOTTLES DRAWN AEROBIC AND ANAEROBIC 4CC  Final   Culture  Setup Time   Final    02/15/2013 14:55 Performed at [place]   Final    NO GROWTH 5 DAYS Performed at [facility]   Report Status 02/21/2013 FINAL  Final  MRSA PCR Screening     Status: None   Collection Time: 02/15/13  2:52 PM  Result Value Ref Range Status   MRSA by PCR NEGATIVE NEGATIVE Final    Comment:        The GeneXpert MRSA Assay (FDA approved for NASAL specimens only), is one component of a comprehensive MRSA colonization surveillance program. It is not intended to diagnose MRSA infection nor to guide or monitor treatment for MRSA infections.  Clostridium Difficile by PCR     Status: None   Collection Time: 02/16/13  8:45 PM  Result Value Ref Range Status   Toxigenic C Difficile by pcr NEGATIVE NEGATIVE Final    Comment: Performed at [facility]    Coagulation Studies: No results for input(s): LABPROT, INR in the  last 72 hours.  Imaging: No results found.     Assessment and plan discussed  with with attending physician and they are in agreement.    Etta Quill PA-C [facility] [phone]  01/10/2016, 3:48 PM   Assessment: 82 y.o. 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   Stroke Risk Factors - none   Recommend: 1. HgbA1c, fasting lipid panel 2. MRI, MRA  of the brain without contrast 3. PT consult, OT consult, Speech consult 4. Echocardiogram 5. Carotid dopplers 6. Prophylactic therapy-Antiplatelet med: Aspirin - dose 81 mg daily 7. Risk factor modification 8. Telemetry monitoring 9. Frequent neuro checks 10 NPO until passes stroke swallow screen 11 please page stroke NP  Or  PA  Or MD from 8am -4 pm  as this patient from this time will be  followed by the stroke.   You can look them up on [email or web address]    Neurology attending:  Henley is a lovely 82 year old patient who presents with dysarthria and a slight right facial droop. 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 The patient was not a candidate for TPA given the mild nature of her symptoms.  Plan:  1. Agree with above plans.   Elson Clan, M.D. Neurohospitalist

## 2016-01-10 NOTE — ED Notes (Signed)
Patient returned from MRI.

## 2016-01-11 ENCOUNTER — Inpatient Hospital Stay (HOSPITAL_BASED_OUTPATIENT_CLINIC_OR_DEPARTMENT_OTHER): Payer: Medicare Other

## 2016-01-11 DIAGNOSIS — R4781 Slurred speech: Secondary | ICD-10-CM | POA: Diagnosis not present

## 2016-01-11 DIAGNOSIS — F0390 Unspecified dementia without behavioral disturbance: Secondary | ICD-10-CM | POA: Diagnosis not present

## 2016-01-11 DIAGNOSIS — I10 Essential (primary) hypertension: Secondary | ICD-10-CM

## 2016-01-11 DIAGNOSIS — Z923 Personal history of irradiation: Secondary | ICD-10-CM | POA: Diagnosis not present

## 2016-01-11 DIAGNOSIS — R29810 Facial weakness: Secondary | ICD-10-CM | POA: Diagnosis not present

## 2016-01-11 LAB — CBC
HCT: 38.6 % (ref 36.0–46.0)
Hemoglobin: 11.5 g/dL — ABNORMAL LOW (ref 12.0–15.0)
MCH: 24.1 pg — ABNORMAL LOW (ref 26.0–34.0)
MCHC: 29.8 g/dL — AB (ref 30.0–36.0)
MCV: 80.8 fL (ref 78.0–100.0)
PLATELETS: 260 10*3/uL (ref 150–400)
RBC: 4.78 MIL/uL (ref 3.87–5.11)
RDW: 14.6 % (ref 11.5–15.5)
WBC: 11.2 10*3/uL — AB (ref 4.0–10.5)

## 2016-01-11 LAB — BASIC METABOLIC PANEL
Anion gap: 9 (ref 5–15)
BUN: 23 mg/dL — AB (ref 6–20)
CALCIUM: 9 mg/dL (ref 8.9–10.3)
CO2: 23 mmol/L (ref 22–32)
Chloride: 107 mmol/L (ref 101–111)
Creatinine, Ser: 1.01 mg/dL — ABNORMAL HIGH (ref 0.44–1.00)
GFR calc Af Amer: 58 mL/min — ABNORMAL LOW (ref >60)
GFR, EST NON AFRICAN AMERICAN: 50 mL/min — AB (ref >60)
GLUCOSE: 87 mg/dL (ref 65–99)
Potassium: 4 mmol/L (ref 3.5–5.1)
SODIUM: 139 mmol/L (ref 135–145)

## 2016-01-11 LAB — LIPID PANEL
CHOL/HDL RATIO: 5.7 ratio
CHOLESTEROL: 211 mg/dL — AB (ref 0–200)
HDL: 37 mg/dL — AB (ref >40)
LDL Cholesterol: 131 mg/dL — ABNORMAL HIGH (ref 0–99)
TRIGLYCERIDES: 215 mg/dL — AB (ref <150)
VLDL: 43 mg/dL — AB (ref 0–40)

## 2016-01-11 MED ORDER — STROKE: EARLY STAGES OF RECOVERY BOOK
Freq: Once | Status: AC
  Administered 2016-01-11: 01:00:00
  Filled 2016-01-11: qty 1

## 2016-01-11 MED ORDER — ASPIRIN EC 81 MG PO TBEC
81.0000 mg | DELAYED_RELEASE_TABLET | Freq: Every day | ORAL | Status: DC
  Administered 2016-01-11 – 2016-01-13 (×3): 81 mg via ORAL
  Filled 2016-01-11 (×3): qty 1

## 2016-01-11 MED ORDER — ENOXAPARIN SODIUM 30 MG/0.3ML ~~LOC~~ SOLN
30.0000 mg | Freq: Every day | SUBCUTANEOUS | Status: DC
  Administered 2016-01-11 – 2016-01-13 (×3): 30 mg via SUBCUTANEOUS
  Filled 2016-01-11 (×3): qty 0.3

## 2016-01-11 MED ORDER — ENOXAPARIN SODIUM 30 MG/0.3ML ~~LOC~~ SOLN: 30.0000 mg | Freq: Every day | SUBCUTANEOUS | Status: DC

## 2016-01-11 MED ORDER — SODIUM CHLORIDE 0.9 % IV SOLN: INTRAVENOUS | Status: DC

## 2016-01-11 MED ORDER — ATORVASTATIN CALCIUM 40 MG PO TABS
40.0000 mg | ORAL_TABLET | Freq: Every day | ORAL | Status: DC
  Administered 2016-01-11 – 2016-01-13 (×3): 40 mg via ORAL
  Filled 2016-01-11 (×3): qty 1

## 2016-01-11 MED ORDER — SENNOSIDES-DOCUSATE SODIUM 8.6-50 MG PO TABS: 1.0000 | ORAL_TABLET | Freq: Every evening | ORAL | Status: DC | PRN

## 2016-01-11 NOTE — Progress Notes (Signed)
VASCULAR LAB PRELIMINARY  PRELIMINARY  PRELIMINARY  PRELIMINARY  Carotid duplex completed.    Preliminary report:  1-39% ICA plaquing.  Vertebral artery flow is antegrade.   Rosaura Bolon, RVT 01/11/2016, 5:23 PM

## 2016-01-11 NOTE — Evaluation (Signed)
Physical Therapy Evaluation Patient Details Name: Jeanne Compton MRN: [id number] DOB: 01-05-1932 Today's Date: 01/11/2016   History of Present Illness  Patient is an 82 yo female admitted 01/10/16 with slurred speech, facial droop, AMS.  MRI negative per report.  PMH:  dementia, anxiety, HTN, chronic back pain, CVA/TIA's, lung CA  Clinical Impression  Patient appears to be functioning at/near baseline level - supervision for gait and decreased cognition.  Per RN, patient at [facility] unit at [facility].  Recommend return to this level of care.  No acute PT needs - PT will sign off.    Follow Up Recommendations Supervision/Assistance - 24 hour;Other (comment) (Return to Memory Care Unit if assist available)    Equipment Recommendations  None recommended by PT    Recommendations for Other Services       Precautions / Restrictions Precautions Precautions: Fall Restrictions Weight Bearing Restrictions: No      Mobility  Bed Mobility               General bed mobility comments: Patient in chair at nursing station.  Transfers Overall transfer level: Needs assistance Equipment used: None Transfers: Sit to/from Stand Sit to Stand: Min guard         General transfer comment: Assist for safety.  Ambulation/Gait Ambulation/Gait assistance: Min guard Ambulation Distance (Feet): 200 Feet Assistive device: None (Holding on to rail in hallway at times) Gait Pattern/deviations: Step-through pattern;Decreased stride length   Gait velocity interpretation: at or above normal speed for age/gender General Gait Details: Patient with fairly steady gait, holding on to rail in hallway at times.  No loss of balance during gait.  Min guard for safety only.  Stairs            Wheelchair Mobility    Modified Rankin (Stroke Patients Only) Modified Rankin (Stroke Patients Only) Pre-Morbid Rankin Score: Moderate disability Modified Rankin: Moderate disability      Balance Overall balance assessment: Needs assistance         Standing balance support: No upper extremity supported;During functional activity Standing balance-Leahy Scale: Good                               Pertinent Vitals/Pain Pain Assessment: Faces Faces Pain Scale: Hurts even more Pain Location: low back Pain Descriptors / Indicators: Sore Pain Intervention(s): Monitored during session;Repositioned    Home Living Family/patient expects to be discharged to:: Assisted living (Patient at [place] unit (per RN))               Home Equipment: Gilford Rile - 2 wheels      Prior Function Level of Independence: Independent;Needs assistance   Gait / Transfers Assistance Needed: Ambulates without RW  ADL's / Homemaking Assistance Needed: Assist with ADL's at facility        Hand Dominance        Extremity/Trunk Assessment   Upper Extremity Assessment: Defer to OT evaluation           Lower Extremity Assessment: Generalized weakness         Communication   Communication: No difficulties  Cognition Arousal/Alertness: Awake/alert Behavior During Therapy: Impulsive;Restless;Anxious Overall Cognitive Status: No family/caregiver present to determine baseline cognitive functioning Area of Impairment: Orientation;Attention;Memory;Safety/judgement;Awareness;Problem solving Orientation Level: Disoriented to;Place;Time;Situation Current Attention Level: Focused Memory: Decreased short-term memory   Safety/Judgement: Decreased awareness of deficits;Decreased awareness of safety   Problem Solving: Slow processing;Difficulty sequencing;Requires verbal cues General  Comments: Patient with very short memory span, repeating herself and asking the same questions within 5 minutes.  Impulsive with decreased safety awareness.  Per chart, appears to be at baseline.    General Comments      Exercises        Assessment/Plan    PT  Assessment Patent does not need any further PT services  PT Diagnosis Generalized weakness;Altered mental status;Abnormality of gait   PT Problem List    PT Treatment Interventions     PT Goals (Current goals can be found in the Care Plan section) Acute Rehab PT Goals Patient Stated Goal: None stated PT Goal Formulation: All assessment and education complete, DC therapy    Frequency     Barriers to discharge        Co-evaluation               End of Session Equipment Utilized During Treatment: Gait belt Activity Tolerance: Patient tolerated treatment well Patient left: in chair;with call bell/phone within reach;with nursing/sitter in room Nurse Communication: Mobility status (Needs 24 hour assist.  Patient at memory care unit pta)         Time: 4136-4383 PT Time Calculation (min) (ACUTE ONLY): 16 min   Charges:   PT Evaluation $PT Eval Moderate Complexity: 1 Procedure     PT G CodesDespina Compton 2016-02-07, 1:46 PM Jeanne Compton, Jeanne Compton Pager [phone]

## 2016-01-11 NOTE — Progress Notes (Signed)
Family Medicine Teaching Service Daily Progress Note Intern Pager: [phone]  Patient name: Jeanne Compton Medical record number: [id number] Date of birth: 1931-10-30 Age: 82 y.o. Gender: female  Primary Care Provider: MAZZOCCHI, Reggy Eye, MD Consultants: none Code Status: FULL  Pt Overview and Major Events to Date:  8/11: direct admit from clinic for right thigh pain and paresthesia, weight loss, night sweats  Assessment and Plan: Jeanne Compton is a 82 y.o. female presenting with right sided facial droop and slurred speech. PMH is significant for dementia, history of lung cancer s/p radiation, HTN, chronic back pain, hx of asthma (not requiring medications currently, hx of TIA/CVA, anxiety.   R Facial Droop and Slurred Speech concern for Stroke/TIA, symptoms improving: MRI brain without acute infarct. MRA head with significant intracranial stenosis involving anterior cerebral, middle cerebral, and posterior cerebral arteries bilaterally.  - ASA '81mg'$   - neuro checks per protocol - neurology consulted, appreciate recommendations - Carotid Dopplers, ECHO - Lipid: chol 211, TAG 215, HDl 37, LDL 131 > will start Lipitor '40mg'$  at night; will also place in DC summary for PCP to discuss continuation in the setting of co-morbidities and age - A1c pending - permissive HTN - PT/OT  Leukocytosis, improving : afebrile. No clinical signs of infectious source. UA with moderate leukocytes and with 6-30 squams, 6-30 wbc, and few bacteria which is not impressive. Denies dysuria. Reports of some nasal congestion which daughter reports is chronic but no cough or sob. Lung exam is unremarkable. CXR not obtained in the ED, however I do not think is currently indicated. Afebrile and stable vitals.  - will monitor for now with labs - urine culture added  Elevated BUN, improving: Creatinine improving 1.01 from 1.2. Hemoglobin slightly down this AM. No signs or symptoms of bleeding.  - will monitor for now     HTN, normotensive: Per ALF, patient is on Lisinopril 2.'5mg'$  daily and Metoprolol ER '25mg'$  daily. BP stable.  - will hold for now to allow permissive HTN   Anxiety:  - Sertraline '25mg'$  daily   - Klonopin 0.'125mg'$  BID (12pm and 8pm)  Chronic Back Pain: Also has Norco 5-325 q12hr PRN which patient has not needed in over a month per ALF, therefore will hold.  - Gabapentin '100mg'$  at 8PM  Dementia: at baseline per daughter   FEN/GI: heart health;SLIV Prophylaxis: Lovenox  Disposition: pending further evaluation for TIA   Subjective:  Doing well this morning. Family not at bedside. No concerns.   Objective: Temp:  [97.5 F (36.4 C)-98.6 F (37 C)] 98.1 F (36.7 C) (08/12 0601) Pulse Rate:  [52-76] 64 (08/12 0800) Resp:  [14-27] 16 (08/12 0800) BP: (116-179)/(51-94) 117/66 (08/12 0800) SpO2:  [94 %-100 %] 97 % (08/12 0800) Weight:  [71.6 kg (157 lb 12.8 oz)] 71.6 kg (157 lb 12.8 oz) (08/12 0023) Physical Exam: GEN: NAD HEENT: Atraumatic, normocephalic, neck supple, EOMI, sclera clear, PERRL  CV: RRR, no murmurs, rubs, or gallops PULM: CTAB, normal effort ABD: Soft, nontender, nondistended, NABS, no organomegaly SKIN: No rash or cyanosis; warm and well-perfused EXTR: No lower extremity edema or calf tenderness PSYCH: Mood and affect euthymic, normal rate and volume of speech NEURO: Awake, alert, no focal deficits grossly, normal speech  Laboratory:  Recent Labs Lab 01/10/16 1608 01/10/16 1631  WBC 13.7*  --   HGB 12.0 12.9  HCT 39.6 38.0  PLT 229  --     Recent Labs Lab 01/10/16 1608 01/10/16 1631  NA 138 140  K 4.4 4.4  CL 106 106  CO2 23  --   BUN 27* 35*  CREATININE 1.22* 1.20*  CALCIUM 9.6  --   PROT 6.1*  --   BILITOT 0.3  --   ALKPHOS 68  --   ALT 19  --   AST 21  --   GLUCOSE 98 94  Lipid:  chol 211, TAG 215, HDl 37, LDL 131  EKG: sinus bradycardia, RBBB (no change from prior)   Imaging/Diagnostic Tests:   Smiley Houseman,  MD 01/11/2016, 8:15 AM PGY-2, [facility] Intern pager: [phone], text pages welcome

## 2016-01-11 NOTE — ED Notes (Signed)
Pt demented, pleasantly confused, tries to get out of bed, CT and MRI negative. Comes from nursing facility, family report pt had slurred speech, no slurred speech noted here.

## 2016-01-11 NOTE — Progress Notes (Signed)
Subjective:  Jeanne Compton appears improved today. Her dysarthria is not as notable area she has a known history of dementia. Her neuro imaging revealed no evidence of acute ischemic change.  Exam: Vitals:   01/11/16 0922 01/11/16 1149  BP: (!) 132/54 (!) 141/71  Pulse: 60 74  Resp: 16 16  Temp:      HEENT-  Normocephalic, no lesions, without obvious abnormality.  Normal external eye and conjunctiva.  Normal TM's bilaterally.  Normal auditory canals and external ears. Normal external nose, mucus membranes and septum.  Normal pharynx. Cardiovascular- regular rate and rhythm, S1, S2 normal, no murmur, click, rub or gallop, pulses palpable throughout   Lungs- chest clear, no wheezing, rales, normal symmetric air entry, Heart exam - S1, S2 normal, no murmur, no gallop, rate regular Abdomen- soft, non-tender; bowel sounds normal; no masses,  no organomegaly Extremities- less then 2 second capillary refill Lymph-no adenopathy palpable Musculoskeletal-no joint tenderness, deformity or swelling Skin-warm and dry, no hyperpigmentation, vitiligo, or suspicious lesions    Gen: In bed, NAD MS: Awake alert, appears at baseline. CN: Cranial nerves II through XII are intact. The dysarthria is significantly improved. Motor: Motor exam is 5 out of 5 bilaterally. Sensory: Intact bilaterally. DTR: 1-2+ bilaterally.  Pertinent Labs/Diagnostics: Reviewed.    Impression:   Jeanne Compton appears significantly improved during today's evaluation. Only subtle dysarthria is noted. She appears back to her baseline. Her neuro imaging revealed no evidence of ischemic change.   Recommendations:  1. Jeanne Compton appears essentially back to her baseline at this point. She is cleared for discharge from a neurological perspective once cleared by the medicine team.  2. Neurology will sign off at this time. Please reconsult with any new issues.   James A. Tasia Catchings, M.D. Neurohospitalist Phone: [phone]  [phone]   01/11/2016, 1:28 PM

## 2016-01-11 NOTE — Evaluation (Signed)
Occupational Therapy Evaluation and Discharge Patient Details Name: Jeanne Compton MRN: [id number] DOB: 20-Feb-1932 Today's Date: 01/11/2016    History of Present Illness Patient is an 82 yo female admitted 01/10/16 with slurred speech, facial droop, AMS.  MRI negative per report.  PMH:  dementia, anxiety, HTN, chronic back pain, CVA/TIA's, lung CA   Clinical Impression   Pt unable to provide PLOF information due to impaired cognition. Pt very impulsive with transfers; found attempting to get up to go to bathroom. Pt currently overall supervision for safety with ADL and functional mobility at this time due to impulsivity and impaired cognition. Per chart review, it appears pt is at baseline functionally and cognitively. Recommend d/c back to Memory Care Unit with 24/7 supervision. No further acute OT needs identified; signing off at this time. Please re-consult if needs change. Thank you for this referral.     Follow Up Recommendations  Supervision/Assistance - 24 hour;Other (comment) (return to Memory Care Unit)    Equipment Recommendations  None recommended by OT    Recommendations for Other Services       Precautions / Restrictions Precautions Precautions: Fall Restrictions Weight Bearing Restrictions: No      Mobility Bed Mobility Overal bed mobility: Modified Independent             General bed mobility comments: Patient in chair at nursing station.  Transfers Overall transfer level: Needs assistance Equipment used: None Transfers: Sit to/from Stand Sit to Stand: Supervision         General transfer comment: Supervision for safety.    Balance Overall balance assessment: Needs assistance Sitting-balance support: Feet supported;No upper extremity supported Sitting balance-Leahy Scale: Good     Standing balance support: No upper extremity supported;During functional activity Standing balance-Leahy Scale: Good                              ADL  Overall ADL's : Needs assistance/impaired Eating/Feeding: Set up;Sitting   Grooming: Supervision/safety;Standing;Wash/dry hands   Upper Body Bathing: Supervision/ safety;Sitting   Lower Body Bathing: Supervison/ safety;Sit to/from stand   Upper Body Dressing : Supervision/safety;Sitting   Lower Body Dressing: Supervision/safety;Sit to/from stand   Toilet Transfer: Supervision/safety;Ambulation;Regular Toilet   Toileting- Water quality scientist and Hygiene: Supervision/safety;Sit to/from stand       Functional mobility during ADLs: Supervision/safety General ADL Comments: Pt overall supervision for safety due to impulsivity and fall risk. Pt very impulsive; found walking in room attempting to get to bathroom.     Vision Vision Assessment?: No apparent visual deficits   Perception     Praxis      Pertinent Vitals/Pain Pain Assessment: Faces Faces Pain Scale: No hurt Pain Location: low back Pain Descriptors / Indicators: Sore Pain Intervention(s): Monitored during session;Repositioned     Hand Dominance     Extremity/Trunk Assessment Upper Extremity Assessment Upper Extremity Assessment: Overall WFL for tasks assessed   Lower Extremity Assessment Lower Extremity Assessment: Defer to PT evaluation       Communication Communication Communication: No difficulties   Cognition Arousal/Alertness: Awake/alert Behavior During Therapy: Impulsive;Restless;Anxious Overall Cognitive Status: No family/caregiver present to determine baseline cognitive functioning Area of Impairment: Orientation;Attention;Memory;Safety/judgement;Awareness;Problem solving Orientation Level: Disoriented to;Place;Time;Situation Current Attention Level: Focused Memory: Decreased short-term memory   Safety/Judgement: Decreased awareness of deficits;Decreased awareness of safety Awareness: Intellectual Problem Solving: Slow processing;Difficulty sequencing;Requires verbal cues General Comments:  Very short term memory; repeats questions and statements within minutes. Very impulsive with decreased safety  awareness. Appears to be baseline but no family present to confirm.   General Comments       Exercises       Shoulder Instructions      Home Living Family/patient expects to be discharged to:: Other (Comment) ([facility])                             Home Equipment: Gilford Rile - 2 wheels          Prior Functioning/Environment Level of Independence: Independent;Needs assistance  Gait / Transfers Assistance Needed: Ambulates without RW ADL's / Homemaking Assistance Needed: Assist with ADL's at facility        OT Diagnosis: Cognitive deficits;Generalized weakness   OT Problem List:     OT Treatment/Interventions:      OT Goals(Current goals can be found in the care plan section) Acute Rehab OT Goals Patient Stated Goal: Go to the bathroom OT Goal Formulation: All assessment and education complete, DC therapy  OT Frequency:     Barriers to D/C:            Co-evaluation              End of Session Nurse Communication: Mobility status  Activity Tolerance: Patient tolerated treatment well Patient left: in bed;with call bell/phone within reach;with bed alarm set   Time: 8756-4332 OT Time Calculation (min): 9 min Charges:  OT General Charges $OT Visit: 1 Procedure OT Evaluation $OT Eval Moderate Complexity: 1 Procedure G-Codes:     Binnie Kand M.S., OTR/L Pager: [phone]  01/11/2016, 5:08 PM

## 2016-01-12 ENCOUNTER — Inpatient Hospital Stay (HOSPITAL_COMMUNITY): Payer: Medicare Other

## 2016-01-12 ENCOUNTER — Encounter (HOSPITAL_COMMUNITY): Payer: Self-pay | Admitting: Radiology

## 2016-01-12 DIAGNOSIS — R4781 Slurred speech: Secondary | ICD-10-CM | POA: Diagnosis not present

## 2016-01-12 DIAGNOSIS — I6789 Other cerebrovascular disease: Secondary | ICD-10-CM

## 2016-01-12 DIAGNOSIS — R29810 Facial weakness: Secondary | ICD-10-CM | POA: Diagnosis not present

## 2016-01-12 DIAGNOSIS — I10 Essential (primary) hypertension: Secondary | ICD-10-CM | POA: Diagnosis not present

## 2016-01-12 LAB — CBC
HEMATOCRIT: 38.2 % (ref 36.0–46.0)
HEMOGLOBIN: 11.7 g/dL — AB (ref 12.0–15.0)
MCH: 24.5 pg — ABNORMAL LOW (ref 26.0–34.0)
MCHC: 30.6 g/dL (ref 30.0–36.0)
MCV: 79.9 fL (ref 78.0–100.0)
Platelets: 273 10*3/uL (ref 150–400)
RBC: 4.78 MIL/uL (ref 3.87–5.11)
RDW: 14.5 % (ref 11.5–15.5)
WBC: 11.9 10*3/uL — AB (ref 4.0–10.5)

## 2016-01-12 LAB — BASIC METABOLIC PANEL
ANION GAP: 11 (ref 5–15)
BUN: 20 mg/dL (ref 6–20)
CHLORIDE: 104 mmol/L (ref 101–111)
CO2: 25 mmol/L (ref 22–32)
Calcium: 9.5 mg/dL (ref 8.9–10.3)
Creatinine, Ser: 1.12 mg/dL — ABNORMAL HIGH (ref 0.44–1.00)
GFR calc Af Amer: 52 mL/min — ABNORMAL LOW (ref >60)
GFR calc non Af Amer: 44 mL/min — ABNORMAL LOW (ref >60)
GLUCOSE: 99 mg/dL (ref 65–99)
POTASSIUM: 4 mmol/L (ref 3.5–5.1)
Sodium: 140 mmol/L (ref 135–145)

## 2016-01-12 LAB — ECHOCARDIOGRAM COMPLETE: Weight: 2524.8 oz

## 2016-01-12 MED ORDER — CLONAZEPAM 0.125 MG PO TBDP
0.1250 mg | ORAL_TABLET | Freq: Once | ORAL | Status: AC
  Administered 2016-01-12: 0.125 mg via ORAL
  Filled 2016-01-12: qty 1

## 2016-01-12 MED ORDER — ATORVASTATIN CALCIUM 40 MG PO TABS: 40.0000 mg | ORAL_TABLET | Freq: Every day | ORAL | 0 refills | Status: DC

## 2016-01-12 MED ORDER — METOPROLOL SUCCINATE ER 25 MG PO TB24
12.5000 mg | ORAL_TABLET | Freq: Every day | ORAL | Status: DC
  Administered 2016-01-12 – 2016-01-13 (×2): 12.5 mg via ORAL
  Filled 2016-01-12 (×2): qty 1

## 2016-01-12 MED ORDER — QUETIAPINE FUMARATE 25 MG PO TABS
25.0000 mg | ORAL_TABLET | Freq: Two times a day (BID) | ORAL | Status: DC
  Administered 2016-01-12 – 2016-01-13 (×2): 25 mg via ORAL
  Filled 2016-01-12 (×4): qty 1

## 2016-01-12 MED ORDER — ASPIRIN 81 MG PO TBEC: 81.0000 mg | DELAYED_RELEASE_TABLET | Freq: Every day | ORAL | 0 refills | Status: DC

## 2016-01-12 MED ORDER — QUETIAPINE FUMARATE 25 MG PO TABS
25.0000 mg | ORAL_TABLET | Freq: Once | ORAL | Status: AC
  Administered 2016-01-12: 25 mg via ORAL
  Filled 2016-01-12: qty 1

## 2016-01-12 MED ORDER — IOPAMIDOL (ISOVUE-300) INJECTION 61%
INTRAVENOUS | Status: AC
  Administered 2016-01-12: 75 mL
  Filled 2016-01-12: qty 75

## 2016-01-12 MED ORDER — LISINOPRIL 2.5 MG PO TABS
2.5000 mg | ORAL_TABLET | Freq: Every day | ORAL | Status: DC
  Administered 2016-01-12 – 2016-01-13 (×2): 2.5 mg via ORAL
  Filled 2016-01-12 (×2): qty 1

## 2016-01-12 MED ORDER — QUETIAPINE FUMARATE 25 MG PO TABS: 25.0000 mg | ORAL_TABLET | Freq: Two times a day (BID) | ORAL | Status: DC

## 2016-01-12 NOTE — Progress Notes (Signed)
  Echocardiogram 2D Echocardiogram has been performed.  Jeanne Compton 01/12/2016, 11:24 AM

## 2016-01-12 NOTE — Progress Notes (Signed)
Patient having increased agitation up in geri chair  Insists on getting up unassisted kicking staff when trying to prevent her from getting up unassisted . Confused and wants "to go to her husband" efforts to redirect unsuccessful. Provider paged.

## 2016-01-12 NOTE — Discharge Summary (Signed)
[facility] Discharge Summary  Patient name: Jeanne Compton Medical record number: [id number] Date of birth: 1932/04/01 Age: 82 y.o. Gender: female Date of Admission: 01/10/2016  Date of Discharge: 01/13/2016 Admitting Physician: Kinnie Feil, MD  Primary Care Provider: MAZZOCCHI, Reggy Eye, MD Consultants: Neurology   Indication for Hospitalization: Right Facial Droop and Slurred Speech   Discharge Diagnoses/Problem List:  Patient Active Problem List   Diagnosis Date Noted  . Dementia   . Slurred speech   . Essential hypertension   . Facial droop 01/10/2016  . Acute encephalopathy 02/15/2013  . Hyponatremia 02/15/2013  . Hypokalemia 02/15/2013  . Lactic acidosis 02/15/2013  . RBBB 02/15/2013    Disposition: ALF   Discharge Condition: Stable   Discharge Exam:  GEN: NAD, sitting at nurses station  HEENT: Atraumatic, normocephalic, neck supple, EOMI, sclera clear, no facial asymmetry  CV: RRR, no murmurs, rubs, or gallops PULM: CTAB, normal effort ABD: Soft, nontender, nondistended, NABS, no organomegaly SKIN: No rash or cyanosis; warm and well-perfused EXTR: No lower extremity edema or calf tenderness PSYCH: Mood and affect euthymic, normal rate and volume of speech NEURO: Awake and alert, oriented to self only, CN II-XII grossly intact, strength 5/5 in all extremities   Brief Hospital Course:  Jeanne Compton is 82 y.o. female with PMH of dementia, history of lung cancer s/p radiation, HTN, chronic back pain, hx of asthma (not requiring medications currently, hx of TIA/CVA, and anxiety who presented with right facial droop and slurred speech concerning for CVA/TIA. CT head was negative for acute process but noted old lacunar infarct. Patient was admitted for further work-up and neurology was consulted.   MRI brain was obtained and evaluated by neurology; was negative for an acute ischemic event. Lipid panel was obtained and based on calculated  ASCVD risk score, statin therapy was started. Carotid dopplers were obtained with a preliminary result of 1-39% ICA plaquing. Echo obtained with result of 60-65% EF and G2DD. Facial droop and slurred speech resolved prior to discharge. Neuro exam was benign at time of discharge and daughter reported that patient was mentating at baseline.   Patient was incidentally found to have what appeared to be an extracardial mass on cardiac echo, and after further workup with chest CT it was discovered that this is a large esophageal hiatal hernia.   Of note, patient was found to have UTI during her hospitalization and she was started on a 7 day course of Keflex (8/14-8/20).   Issues for Follow Up:  1. Patient started on 81 mg ASA and Lipitor 40 mg. PCP should discuss continuation in setting of co-morbidities and age.  2. Patient discharged on a 7 day course of Keflex for UTI (8/14-8/20).     Significant Procedures: None   Significant Labs and Imaging:   Recent Labs Lab 01/11/16 0842 01/12/16 0334 01/13/16 0732  WBC 11.2* 11.9* 15.4*  HGB 11.5* 11.7* 11.9*  HCT 38.6 38.2 38.9  PLT 260 273 262    Recent Labs Lab 01/10/16 1608 01/10/16 1631 01/11/16 0842 01/12/16 0334 01/13/16 0732  NA 138 140 139 140 138  K 4.4 4.4 4.0 4.0 4.4  CL 106 106 107 104 105  CO2 23  --  '23 25 24  '$ GLUCOSE 98 94 87 99 117*  BUN 27* 35* 23* 20 22*  CREATININE 1.22* 1.20* 1.01* 1.12* 1.15*  CALCIUM 9.6  --  9.0 9.5 9.5  ALKPHOS 68  --   --   --   --  AST 21  --   --   --   --   ALT 19  --   --   --   --   ALBUMIN 3.7  --   --   --   --     Ct Head Wo Contrast  Result Date: 01/10/2016 CLINICAL DATA:  Slurred speech and facial droop. Last seen normal yesterday. History of lung carcinoma and dementia. EXAM: CT HEAD WITHOUT CONTRAST TECHNIQUE: Contiguous axial images were obtained from the base of the skull through the vertex without intravenous contrast. COMPARISON:  02/15/2013 FINDINGS: The ventricles are  normal in configuration. There is ventricular and sulcal enlargement reflecting moderate atrophy. There are no parenchymal masses or mass effect. There is an old lacune infarct in the left caudate nucleus head. There is no evidence of a recent infarct. Patchy areas of white matter hypoattenuation noted consistent with mild chronic microvascular ischemic change. There are no extra-axial masses or abnormal fluid collections. There is no intracranial hemorrhage. The visualized sinuses and mastoid air cells are clear. No skull lesion. IMPRESSION: 1. No acute intracranial abnormalities. 2. Atrophy, old left caudate nucleus lacune infarct and mild chronic microvascular ischemic change. Electronically Signed   By: Lajean Manes M.D.   On: 01/10/2016 15:51   Ct Chest W Contrast  Result Date: 01/12/2016 CLINICAL DATA:  Follow-up on the extracardiac mass seen on echocardiogram. EXAM: CT CHEST WITH CONTRAST TECHNIQUE: Multidetector CT imaging of the chest was performed during intravenous contrast administration. CONTRAST:  65m ISOVUE-300 IOPAMIDOL (ISOVUE-300) INJECTION 61% COMPARISON:  CT abdomen and pelvis 02/15/2013.  Chest 02/15/2013. FINDINGS: Cardiovascular: Normal heart size. Normal caliber thoracic aorta. Mild aortic calcification and atherosclerotic change. Great vessels are patent. Mediastinum/Nodes: Large esophageal hiatal hernia behind the heart. Scattered mediastinal lymph nodes are not pathologically enlarged. Esophagus is decompressed. Small sub cm nodules in the thyroid gland. Lungs/Pleura: Nodular scarring in the right upper lung. This was present on previous chest radiograph from 2014 and is likely chronic. No focal consolidation or airspace disease. Atelectasis and scarring in the lung bases. Upper Abdomen: Diffuse fatty infiltration of the liver. Musculoskeletal: Degenerative changes in the spine. No destructive bone lesions. IMPRESSION: Large esophageal hiatal hernia behind the heart comprising most  of the stomach. Nodular scarring in the right upper lung appears to be chronic. Electronically Signed   By: WLucienne CapersM.D.   On: 01/12/2016 21:25   Mr Brain Wo Contrast  Result Date: 01/10/2016 CLINICAL DATA:  Facial droop and slurred speech. History of lung cancer. EXAM: MRI HEAD WITHOUT CONTRAST MRA HEAD WITHOUT CONTRAST TECHNIQUE: Multiplanar, multiecho pulse sequences of the brain and surrounding structures were obtained without intravenous contrast. Angiographic images of the head were obtained using MRA technique without contrast. COMPARISON:  CT head 01/10/2016 FINDINGS: MRI HEAD FINDINGS Image quality degraded by motion. Moderate atrophy. Negative for acute infarct Chronic infarct in the head of the caudate lobe on the left. Chronic microvascular ischemic change in the white matter and pons. Negative for hemorrhage or fluid collection. Negative for mass or edema.  No shift of the midline structures. Mucosal edema paranasal sinuses. Bilateral cataract extraction. Pituitary normal in size. MRA HEAD FINDINGS Image quality degraded by moderate motion. Both vertebral arteries patent to the basilar. PICA patent bilaterally. Basilar appears diffusely diseased without significant stenosis. Posterior cerebral arteries appear diffusely diseased with moderate to severe stenosis on the right and moderate stenosis on the left Internal carotid artery patent bilaterally without significant stenosis. Multiple areas of decreased  signal throughout the middle cerebral artery branches bilaterally compatible with atherosclerotic disease. Mild to moderate stenosis left M1 segment. Irregularity of the anterior cerebral arteries bilaterally consistent with atherosclerotic disease Negative for cerebral aneurysm IMPRESSION: Atrophy and chronic microvascular ischemia. No acute intracranial abnormality MRA degraded by motion. There appears to be significant intracranial stenosis involving the anterior cerebral arteries,  middle cerebral arteries, and posterior cerebral arteries bilaterally. Electronically Signed   By: Franchot Gallo M.D.   On: 01/10/2016 20:47   Mr Jodene Nam Head/brain KD Cm  Result Date: 01/10/2016 CLINICAL DATA:  Facial droop and slurred speech. History of lung cancer. EXAM: MRI HEAD WITHOUT CONTRAST MRA HEAD WITHOUT CONTRAST TECHNIQUE: Multiplanar, multiecho pulse sequences of the brain and surrounding structures were obtained without intravenous contrast. Angiographic images of the head were obtained using MRA technique without contrast. COMPARISON:  CT head 01/10/2016 FINDINGS: MRI HEAD FINDINGS Image quality degraded by motion. Moderate atrophy. Negative for acute infarct Chronic infarct in the head of the caudate lobe on the left. Chronic microvascular ischemic change in the white matter and pons. Negative for hemorrhage or fluid collection. Negative for mass or edema.  No shift of the midline structures. Mucosal edema paranasal sinuses. Bilateral cataract extraction. Pituitary normal in size. MRA HEAD FINDINGS Image quality degraded by moderate motion. Both vertebral arteries patent to the basilar. PICA patent bilaterally. Basilar appears diffusely diseased without significant stenosis. Posterior cerebral arteries appear diffusely diseased with moderate to severe stenosis on the right and moderate stenosis on the left Internal carotid artery patent bilaterally without significant stenosis. Multiple areas of decreased signal throughout the middle cerebral artery branches bilaterally compatible with atherosclerotic disease. Mild to moderate stenosis left M1 segment. Irregularity of the anterior cerebral arteries bilaterally consistent with atherosclerotic disease Negative for cerebral aneurysm IMPRESSION: Atrophy and chronic microvascular ischemia. No acute intracranial abnormality MRA degraded by motion. There appears to be significant intracranial stenosis involving the anterior cerebral arteries, middle  cerebral arteries, and posterior cerebral arteries bilaterally. Electronically Signed   By: Franchot Gallo M.D.   On: 01/10/2016 20:47    Ct Chest W Contrast  Result Date: 01/12/2016 CLINICAL DATA:  Follow-up on the extracardiac mass seen on echocardiogram. EXAM: CT CHEST WITH CONTRAST TECHNIQUE: Multidetector CT imaging of the chest was performed during intravenous contrast administration. CONTRAST:  32m ISOVUE-300 IOPAMIDOL (ISOVUE-300) INJECTION 61% COMPARISON:  CT abdomen and pelvis 02/15/2013.  Chest 02/15/2013. FINDINGS: Cardiovascular: Normal heart size. Normal caliber thoracic aorta. Mild aortic calcification and atherosclerotic change. Great vessels are patent. Mediastinum/Nodes: Large esophageal hiatal hernia behind the heart. Scattered mediastinal lymph nodes are not pathologically enlarged. Esophagus is decompressed. Small sub cm nodules in the thyroid gland. Lungs/Pleura: Nodular scarring in the right upper lung. This was present on previous chest radiograph from 2014 and is likely chronic. No focal consolidation or airspace disease. Atelectasis and scarring in the lung bases. Upper Abdomen: Diffuse fatty infiltration of the liver. Musculoskeletal: Degenerative changes in the spine. No destructive bone lesions. IMPRESSION: Large esophageal hiatal hernia behind the heart comprising most of the stomach. Nodular scarring in the right upper lung appears to be chronic. Electronically Signed   By: WLucienne CapersM.D.   On: 01/12/2016 21:25    Results/Tests Pending at Time of Discharge: Hemoglobin A1C    Discharge Medications:    Medication List    STOP taking these medications   HYDROcodone-acetaminophen 5-325 MG tablet Commonly known as:  NORCO/VICODIN     TAKE these medications   aspirin 81 MG  EC tablet Take 1 tablet (81 mg total) by mouth daily.   atorvastatin 40 MG tablet Commonly known as:  LIPITOR Take 1 tablet (40 mg total) by mouth daily at 6 PM.   cephALEXin 500 MG  capsule Commonly known as:  KEFLEX Take 1 capsule (500 mg total) by mouth every 12 (twelve) hours.   clonazePAM 0.5 MG disintegrating tablet Commonly known as:  KLONOPIN Take 1 tablet (0.5 mg total) by mouth 2 (two) times daily. What changed:  how much to take   gabapentin 100 MG capsule Commonly known as:  NEURONTIN Take 100 mg by mouth at bedtime. For pain   lisinopril 2.5 MG tablet Commonly known as:  PRINIVIL,ZESTRIL Take 2.5 mg by mouth daily.   metoprolol succinate 25 MG 24 hr tablet Commonly known as:  TOPROL-XL Take 12.5 mg by mouth daily.   psyllium 0.52 g capsule Commonly known as:  REGULOID Take 0.52 g by mouth daily.   sertraline 25 MG tablet Commonly known as:  ZOLOFT Take 25 mg by mouth every morning. anxiety       Discharge Instructions: Please refer to Patient Instructions section of EMR for full details.  Patient was counseled important signs and symptoms that should prompt return to medical care, changes in medications, dietary instructions, activity restrictions, and follow up appointments.   Follow-Up Appointments: Follow-up Information    MAZZOCCHI, Reggy Eye, MD. Schedule an appointment as soon as possible for a visit today.   Specialty:  Family Medicine Contact information: [address] Kristeen Mans[address] [place]  [address] [phone]          Everrett Coombe, MD PGY-1

## 2016-01-12 NOTE — Progress Notes (Signed)
Spoke with daughter again. She will like to get the CT scan done prior to discharge. We will order CT and give discharge recommendation based on the result. Resident informed to order CT chest.

## 2016-01-12 NOTE — Progress Notes (Signed)
Family Medicine Teaching Service Daily Progress Note Intern Pager: [phone]  Patient name: Jeanne Compton Medical record number: [id number] Date of birth: [birth year] Age: 82 y.o. Gender: female  Primary Care Provider: MAZZOCCHI, Reggy Eye, MD Consultants: none Code Status: FULL  Pt Overview and Major Events to Date:  8/11: admitted for right facial droop and slurred speech   Assessment and Plan: Jeanne Compton is a 82 y.o. female presenting with right sided facial droop and slurred speech. PMH is significant for dementia, history of lung cancer s/p radiation, HTN, chronic back pain, hx of asthma (not requiring medications currently, hx of TIA/CVA, anxiety.   R Facial Droop and Slurred Speech concern for Stroke/TIA, symptoms Resolved: MRI brain without acute infarct. MRA head with significant intracranial stenosis involving anterior cerebral, middle cerebral, and posterior cerebral arteries bilaterally.  - ASA '81mg'$   - neuro checks per protocol - neurology consulted, appreciate recommendations >> neurology has signed off  - Carotid Dopplers, preliminary result: 1-39% ICA plaquing, vertebral artery flow antegrade  -ECHO ordered  - Lipid: chol 211, TAG 215, HDl 37, LDL 131 > will start Lipitor '40mg'$  at night; will also place in DC summary for PCP to discuss continuation in the setting of co-morbidities and age - A1c pending - permissive HTN allowed for over 24 hours, will resume home Metoprolol and Lisinopril now  - PT/OT  Leukocytosis, improving : afebrile. No clinical signs of infectious source. UA with moderate leukocytes and with 6-30 squams, 6-30 wbc, and few bacteria which is not impressive. Denies dysuria. Reports of some nasal congestion which daughter reports is chronic but no cough or sob. Lung exam is unremarkable. CXR not obtained in the ED, however I do not think is currently indicated. Afebrile and stable vitals.  - will monitor for now with labs - urine culture added  Elevated  BUN, improving: Creatinine improving 1.01 from 1.2. Hemoglobin slightly down this AM. No signs or symptoms of bleeding.  - will monitor for now    HTN: Per ALF, patient is on Lisinopril 2.'5mg'$  daily and Metoprolol ER '25mg'$  daily. BPs have been elevated.  - resume Metoprolol and Lisinopril   Anxiety:  - Sertraline '25mg'$  daily   - Klonopin 0.'125mg'$  BID (12pm and 8pm)  Chronic Back Pain: Also has Norco 5-325 q12hr PRN which patient has not needed in over a month per ALF, therefore will hold.  - Gabapentin '100mg'$  at 8PM  Dementia: at baseline per daughter   FEN/GI: heart health;SLIV Prophylaxis: Lovenox  Disposition: pending further evaluation for TIA   Subjective:  Doing well this morning. No complaints.   Objective: Temp:  [98 F (36.7 C)-98.8 F (37.1 C)] 98.1 F (36.7 C) (08/13 0449) Pulse Rate:  [58-74] 69 (08/13 0449) Resp:  [16-20] 20 (08/13 0449) BP: (117-182)/(54-71) 150/67 (08/13 0449) SpO2:  [95 %-98 %] 95 % (08/13 0449) Physical Exam: GEN: NAD, sitting at nurses station  HEENT: Atraumatic, normocephalic, neck supple, EOMI, sclera clear, no facial asymmetry  CV: RRR, no murmurs, rubs, or gallops PULM: CTAB, normal effort ABD: Soft, nontender, nondistended, NABS, no organomegaly SKIN: No rash or cyanosis; warm and well-perfused EXTR: No lower extremity edema or calf tenderness PSYCH: Mood and affect euthymic, normal rate and volume of speech NEURO: Awake and alert, oriented to self only, CN II-XII grossly intact, strength 5/5 in all extremities   Laboratory:  Recent Labs Lab 01/10/16 1608 01/10/16 1631 01/11/16 0842 01/12/16 0334  WBC 13.7*  --  11.2* 11.9*  HGB 12.0 12.9 11.5* 11.7*  HCT 39.6 38.0 38.6 38.2  PLT 229  --  260 273    Recent Labs Lab 01/10/16 1608 01/10/16 1631 01/11/16 0842 01/12/16 0334  NA 138 140 139 140  K 4.4 4.4 4.0 4.0  CL 106 106 107 104  CO2 23  --  23 25  BUN 27* 35* 23* 20  CREATININE 1.22* 1.20* 1.01* 1.12*   CALCIUM 9.6  --  9.0 9.5  PROT 6.1*  --   --   --   BILITOT 0.3  --   --   --   ALKPHOS 68  --   --   --   ALT 19  --   --   --   AST 21  --   --   --   GLUCOSE 98 94 87 99  Lipid:  chol 211, TAG 215, HDl 37, LDL 131  EKG: sinus bradycardia, RBBB (no change from prior)   Imaging/Diagnostic Tests:   Nicolette Bang, DO 01/12/2016, 7:53 AM PGY-2, [facility] Intern pager: [phone], text pages welcome

## 2016-01-12 NOTE — Progress Notes (Signed)
CSW spoke with [place] [phone]. CSW informed Lannette Donath that pt is ready for d/c and asked if facility would be available to accept pt back today. Lannette Donath states that she is already out of town for the day today. Pt can return tomorrow no later than 1pm. RN notified.   Georga Kaufmann, MSW, [place] (Weekend coverage)

## 2016-01-13 DIAGNOSIS — F0390 Unspecified dementia without behavioral disturbance: Secondary | ICD-10-CM | POA: Diagnosis not present

## 2016-01-13 DIAGNOSIS — R29810 Facial weakness: Secondary | ICD-10-CM | POA: Diagnosis not present

## 2016-01-13 DIAGNOSIS — I10 Essential (primary) hypertension: Secondary | ICD-10-CM | POA: Diagnosis not present

## 2016-01-13 LAB — URINE CULTURE: Culture: >=100000 — AB

## 2016-01-13 LAB — BASIC METABOLIC PANEL
ANION GAP: 9 (ref 5–15)
BUN: 22 mg/dL — ABNORMAL HIGH (ref 6–20)
CALCIUM: 9.5 mg/dL (ref 8.9–10.3)
CO2: 24 mmol/L (ref 22–32)
Chloride: 105 mmol/L (ref 101–111)
Creatinine, Ser: 1.15 mg/dL — ABNORMAL HIGH (ref 0.44–1.00)
GFR, EST AFRICAN AMERICAN: 50 mL/min — AB (ref >60)
GFR, EST NON AFRICAN AMERICAN: 43 mL/min — AB (ref >60)
Glucose, Bld: 117 mg/dL — ABNORMAL HIGH (ref 65–99)
POTASSIUM: 4.4 mmol/L (ref 3.5–5.1)
Sodium: 138 mmol/L (ref 135–145)

## 2016-01-13 LAB — VAS US CAROTID
LCCADDIAS: 12 cm/s
LEFT ECA DIAS: -4 cm/s
LEFT VERTEBRAL DIAS: -14 cm/s
Left CCA dist sys: 86 cm/s
Left CCA prox dias: 11 cm/s
Left CCA prox sys: 95 cm/s
Left ICA dist dias: -14 cm/s
Left ICA dist sys: -77 cm/s
Left ICA prox dias: -14 cm/s
Left ICA prox sys: -57 cm/s
RCCADSYS: -83 cm/s
RCCAPDIAS: 7 cm/s
RCCAPSYS: 101 cm/s
RIGHT ECA DIAS: -4 cm/s
RIGHT VERTEBRAL DIAS: -6 cm/s

## 2016-01-13 LAB — CBC
HEMATOCRIT: 38.9 % (ref 36.0–46.0)
Hemoglobin: 11.9 g/dL — ABNORMAL LOW (ref 12.0–15.0)
MCH: 24.7 pg — ABNORMAL LOW (ref 26.0–34.0)
MCHC: 30.6 g/dL (ref 30.0–36.0)
MCV: 80.7 fL (ref 78.0–100.0)
PLATELETS: 262 10*3/uL (ref 150–400)
RBC: 4.82 MIL/uL (ref 3.87–5.11)
RDW: 14.8 % (ref 11.5–15.5)
WBC: 15.4 10*3/uL — AB (ref 4.0–10.5)

## 2016-01-13 LAB — HEMOGLOBIN A1C
HEMOGLOBIN A1C: 6.5 % — AB (ref 4.8–5.6)
Mean Plasma Glucose: 140 mg/dL

## 2016-01-13 MED ORDER — CEPHALEXIN 500 MG PO CAPS
500.0000 mg | ORAL_CAPSULE | Freq: Two times a day (BID) | ORAL | Status: DC
  Administered 2016-01-13: 500 mg via ORAL
  Filled 2016-01-13: qty 1

## 2016-01-13 MED ORDER — CLONAZEPAM 0.5 MG PO TBDP: 0.5000 mg | ORAL_TABLET | Freq: Two times a day (BID) | ORAL | 0 refills | Status: DC

## 2016-01-13 MED ORDER — CEPHALEXIN 500 MG PO CAPS: 500.0000 mg | ORAL_CAPSULE | Freq: Two times a day (BID) | ORAL | 0 refills | Status: AC

## 2016-01-13 NOTE — Discharge Instructions (Signed)
You were admitted to the hospital because you had a facial droop and slurred speech that looked like a mini stroke or TIA, and your symptoms resolved on their own.  On your MRA it appeared that there was narrowing of some of the blood vessels in your brain, and therefore you were started on aspirin 81 mg daily and Lipitor 40 mg daily.  You were also found to have a urinary tract infection during your hospitalization and so you were discharged with 7 days of antibiotics.

## 2016-01-13 NOTE — Clinical Social Work Note (Signed)
Jeanne Compton is medically stable for discharge today and will return to [facility] ALF. Facility contacted and d/c clinicals transmitted through [place]. Daughter contacted and will transport patient back to ALF.   Jordyn Hofacker G[place], MSW, LCSW Licensed Clinical Social Worker [place] [phone]

## 2016-01-13 NOTE — Care Management Note (Signed)
Case Management Note  Patient Details  Name: Jeanne Compton MRN: [id number] Date of Birth: [birth year]  Subjective/Objective:   Pt in with facial droop. She is from [facility] ALF/ Memory care.                  Action/Plan: Plan is for patient to return to [facility] today. No further needs per CM.    Expected Discharge Date:                  Expected Discharge Plan:  Assisted Living / Rest Home  In-House Referral:  Clinical Social Work  Discharge planning Services  CM Consult  Post Acute Care Choice:    Choice offered to:     DME Arranged:    DME Agency:     HH Arranged:    HH Agency:     Status of Service:  Completed, signed off  If discussed at [employer], dates discussed:    Additional Comments:  Pollie Friar, RN 01/13/2016, 1:25 PM

## 2016-01-13 NOTE — Care Management CC44 (Signed)
Condition Code 44 Documentation Completed  Patient Details  Name: Jeanne Compton MRN: [id number] Date of Birth: 01/28/1932   Condition Code 44 given:  Yes Patient signature on Condition Code 44 notice:  Yes Documentation of 2 MD's agreement:  Yes Code 44 added to claim:  Yes    Pollie Friar, RN 01/13/2016, 12:43 PM

## 2016-01-13 NOTE — Clinical Social Work Note (Signed)
Clinical Social Work Assessment  Patient Details  Name: Jeanne Compton MRN: [id number] Date of Birth: 06-Jan-1932  Date of referral:  01/13/16               Reason for consult:  Facility Placement                Permission sought to share information with:  Other (Patient has some confusion, daughter contacted) Permission granted to share information::  No (Verbal consent not given due to patient confusion)  Name::     Jeanne Compton  Agency::     Relationship::  Daughter  Contact Information:  [phone]  Housing/Transportation Living arrangements for the past 2 months:  [facility]) Source of Information:  Adult Children Patient Interpreter Needed:    Criminal Activity/Legal Involvement Pertinent to Current Situation/Hospitalization:  No - Comment as needed Significant Relationships:  Adult Children Lives with:  Facility Resident Do you feel safe going back to the place where you live?  Yes Need for family participation in patient care:  Yes (Comment)  Care giving concerns:  None expressed by daughter   Facilities manager / plan: Daughter contacted on day of discharge regarding patient d/c and ambulance transport. Ms. Harrington Challenger indicated that she would transport patient back to ALF.  Employment status:  Retired Health visitor, Managed Care PT Recommendations:  [address] [facility] [place] [facility] [place] / Referral to community resources:  Other (Comment Required) (None needed or requested as patient returning to ALF)  Patient/Family's Response to care:  No concerns from daughter regarding care during hospitalization.  Patient/Family's Understanding of and Emotional Response to Diagnosis, Current Treatment, and Prognosis: Not discussed.  Emotional Assessment Appearance:  Appears stated age Attitude/Demeanor/Rapport:  Unable to Assess (Looked in on patient but did not engage in conversation) Affect (typically observed):  Unable to  Assess Orientation:  Oriented to Self Alcohol / Substance use:  Never Used Psych involvement (Current and /or in the community):  No (Comment)  Discharge Needs  Concerns to be addressed:  Discharge Planning Concerns Readmission within the last 30 days:  No Current discharge risk:  None Barriers to Discharge:      Sable Feil, LCSW 01/13/2016, 6:02 PM

## 2016-01-13 NOTE — Progress Notes (Signed)
Speech Language Pathology  Patient Details Name: Jeanne Compton MRN: [id number] DOB: [birth year] Today's Date: 01/13/2016 Time:  -     Reviewed chart and PT/OT notes. Pt resides at a memory care facility with decreased cognition at baseline. MRI did not reveal abnormalities. Speech-language-cognition assessment not needed currently. Pt scheduled to return to memory care today per chart.   Orbie Pyo [place] [phone]

## 2016-01-13 NOTE — Progress Notes (Signed)
Family Medicine Teaching Service Daily Progress Note Intern Pager: [phone]  Patient name: Jeanne Compton Medical record number: [id number] Date of birth: 1932-05-14 Age: 82 y.o. Gender: female  Primary Care Provider: MAZZOCCHI, Reggy Eye, MD Consultants: None  Code Status: Full  Pt Overview and Major Events to Date:  8/11: admitted for right facial droop and slurred speech    Assessment and Plan: Jeanne Compton a 82 y.o.femalepresenting with right sided facial droop and slurred speech. PMH is significant for dementia, history of lung cancer s/p radiation, HTN, chronic back pain, hx of asthma (not requiring medications currently, hx of TIA/CVA, anxiety.    #R Facial Droop and Slurred Speechconcern for Stroke/TIA, symptoms Resolved:MRI brain without acute infarct. MRA head with significant intracranial stenosis involving anterior cerebral, middle cerebral, and posterior cerebral arteries bilaterally. - Carotid Dopplers, preliminary result: 1-39% ICA plaquing, vertebral artery flow antegrade  -ECHO - EF 60-65%, G2DD, +incidental pericardial/extracardiac echodense structure adjacent to left atrium of unclear etiology - ASA '81mg'$   - neuro checks per protocol - neurology signed off - A1c pending - PT/OT - recommend 24h surveillance/ return to memory care unit - given hypercholesterolemia on lipid panel, started Lipitor '40mg'$  at night; will also place in DC summary for PCP to discuss continuation in the setting of co-morbidities and age  #UTI - patient was found to havepositive UA and now urine culture >100,000 colonies Ecoli. Leukocytosis is worsened at 15.4 today from 11.9 yesterday.   - Started Keflex BID, first dose 8/14 for 10 days (8/14>>8/24)  #Hiatal hernia  - incidentally seen on cardiac echo as an extracardiac mass, however on Chest CT it was found to be a large esophageal hiatal hernia behind the heart. - no further workup   #Elevated BUN, improving: Creatinine improving at  1.12 from 1.2. Hemoglobin stable. No signs or symptoms of bleeding.  - will monitor for now   #HTN:stable.  Blood pressures controlled on home regimen - continue Metoprolol and Lisinopril   #Anxiety: Patient with increased agitation overnight, reportedly kicking the nurse.  Was given extra sertraline 25 mg and Klonopin 0.125 mg in the evening per pharmacy recommendation. - Sertraline '25mg'$  BID - Klonopin 0.'125mg'$  BID (12pm and 8pm)  #Chronic Back Pain: Also has Norco 5-325 q12hr PRN which patient has not needed in over a month per ALF, therefore will hold.  - Gabapentin '100mg'$  at 8PM  #Dementia: at baseline per daughter   FEN/GI: heart health;SLIV Prophylaxis: Lovenox  Disposition: Back to ALF  Subjective:  Patient seems confused this morning, convinced she is not at the hospital.  This seems to be her baseline per previous notes.  Otherwise with no complaints.   Objective: Temp:  [97.7 F (36.5 C)-98.7 F (37.1 C)] 98.5 F (36.9 C) (08/14 0931) Pulse Rate:  [74-91] 91 (08/14 0931) Resp:  [18-20] 20 (08/14 0931) BP: (117-163)/(60-79) 117/61 (08/14 0931) SpO2:  [96 %-98 %] 96 % (08/14 0931) Physical Exam: General: NAD, rests comfortably in bed Cardiovascular: RRR, no m/r/g Respiratory: CTA bil, no W/R/R Abdomen: soft, nontender, nondistended, normoactive BS, no HSM Extremities: full ROM in four extremities, no edema LE bilaterally  Laboratory:  Recent Labs Lab 01/11/16 0842 01/12/16 0334 01/13/16 0732  WBC 11.2* 11.9* 15.4*  HGB 11.5* 11.7* 11.9*  HCT 38.6 38.2 38.9  PLT 260 273 262    Recent Labs Lab 01/10/16 1608  01/11/16 0842 01/12/16 0334 01/13/16 0732  NA 138  < > 139 140 138  K 4.4  < > 4.0 4.0 4.4  CL 106  < > 107 104 105  CO2 23  --  '23 25 24  '$ BUN 27*  < > 23* 20 22*  CREATININE 1.22*  < > 1.01* 1.12* 1.15*  CALCIUM 9.6  --  9.0 9.5 9.5  PROT 6.1*  --   --   --   --   BILITOT 0.3  --   --   --   --   ALKPHOS 68  --   --   --   --   ALT  19  --   --   --   --   AST 21  --   --   --   --   GLUCOSE 98  < > 87 99 117*  < > = values in this interval not displayed.  Imaging/Diagnostic Tests: Ct Chest W Contrast  Result Date: 01/12/2016 CLINICAL DATA:  Follow-up on the extracardiac mass seen on echocardiogram. EXAM: CT CHEST WITH CONTRAST TECHNIQUE: Multidetector CT imaging of the chest was performed during intravenous contrast administration. CONTRAST:  13m ISOVUE-300 IOPAMIDOL (ISOVUE-300) INJECTION 61% COMPARISON:  CT abdomen and pelvis 02/15/2013.  Chest 02/15/2013. FINDINGS: Cardiovascular: Normal heart size. Normal caliber thoracic aorta. Mild aortic calcification and atherosclerotic change. Great vessels are patent. Mediastinum/Nodes: Large esophageal hiatal hernia behind the heart. Scattered mediastinal lymph nodes are not pathologically enlarged. Esophagus is decompressed. Small sub cm nodules in the thyroid gland. Lungs/Pleura: Nodular scarring in the right upper lung. This was present on previous chest radiograph from 2014 and is likely chronic. No focal consolidation or airspace disease. Atelectasis and scarring in the lung bases. Upper Abdomen: Diffuse fatty infiltration of the liver. Musculoskeletal: Degenerative changes in the spine. No destructive bone lesions. IMPRESSION: Large esophageal hiatal hernia behind the heart comprising most of the stomach. Nodular scarring in the right upper lung appears to be chronic. Electronically Signed   By: WLucienne CapersM.D.   On: 01/12/2016 21:25    LEverrett Coombe MD 01/13/2016, 11:07 AM PGY-1, CProspectIntern pager: [phone] text pages welcome

## 2016-01-13 NOTE — Progress Notes (Signed)
Provider called with  Orders.

## 2016-01-13 NOTE — NC FL2 (Signed)
[place] MEDICAID FL2 LEVEL OF CARE SCREENING TOOL     IDENTIFICATION  Patient Name: Jeanne Compton Birthdate: 24-Dec-1931 Sex: female Admission Date (Current Location): 01/10/2016  [facility] and [place] Number:  Herbalist and Address:  The . [facility], [place] [address] [place], [place] [address]      Provider Number: [id number]  Attending Physician Name and Address:  Kinnie Feil, MD  Relative Name and Phone Number:  [name] - daughter.  [phone]    Current Level of Care: Hospital Recommended Level of Care: [facility]) Prior Approval Number:    Date Approved/Denied:   PASRR Number:    Discharge Plan: Other (Comment) (ALF)    Current Diagnoses: Patient Active Problem List   Diagnosis Date Noted  . Dementia   . Slurred speech   . Essential hypertension   . Facial droop 01/10/2016  . Acute encephalopathy 02/15/2013  . Hyponatremia 02/15/2013  . Hypokalemia 02/15/2013  . Lactic acidosis 02/15/2013  . RBBB 02/15/2013    Orientation RESPIRATION BLADDER Height & Weight     Self, Time, Situation, Place  Normal Continent Weight: 157 lb 12.8 oz (71.6 kg) Height:     BEHAVIORAL SYMPTOMS/MOOD NEUROLOGICAL BOWEL NUTRITION STATUS      Continent Diet (Low sodium - Heart healthy)  AMBULATORY STATUS COMMUNICATION OF NEEDS Skin   Limited Assist Verbally Normal                       Personal Care Assistance Level of Assistance  Bathing, Feeding, Dressing Bathing Assistance: Limited assistance (Supervision) Feeding assistance: Independent Dressing Assistance: Limited assistance (Supervision)     Functional Limitations Info  Sight, Hearing, Speech Sight Info: Adequate Hearing Info: Adequate Speech Info: Adequate    SPECIAL CARE FACTORS FREQUENCY  PT (By licensed PT), OT (By licensed OT)     PT Frequency: PT evaluation 01/11/16 OT Frequency: OT evaluation 01/11/16            Contractures  Contractures Info: Not present    Additional Factors Info  Allergies   Allergies Info: Compazine           Current Medications (01/13/2016):  This is the current hospital active medication list Current Facility-Administered Medications  Medication Dose Route Frequency Provider Last Rate Last Dose  . 0.9 %  sodium chloride infusion   Intravenous Continuous Smiley Houseman, MD      . acetaminophen (TYLENOL) tablet 650 mg  650 mg Oral Q6H PRN Smiley Houseman, MD   650 mg at 01/12/16 1539  . aspirin EC tablet 81 mg  81 mg Oral Daily Smiley Houseman, MD   81 mg at 01/13/16 1254  . atorvastatin (LIPITOR) tablet 40 mg  40 mg Oral q1800 Smiley Houseman, MD   40 mg at 01/12/16 1651  . cephALEXin (KEFLEX) capsule 500 mg  500 mg Oral Q12H Bufford Lope, DO   500 mg at 01/13/16 1253  . clonazepam (KLONOPIN) disintegrating tablet 0.125 mg  0.125 mg Oral BID Smiley Houseman, MD   0.125 mg at 01/13/16 0859  . enoxaparin (LOVENOX) injection 30 mg  30 mg Subcutaneous Q0600 Kinnie Feil, MD   30 mg at 01/13/16 0611  . gabapentin (NEURONTIN) capsule 100 mg  100 mg Oral Daily Smiley Houseman, MD   100 mg at 01/12/16 1944  . lisinopril (PRINIVIL,ZESTRIL) tablet 2.5 mg  2.5 mg Oral Daily Nicolette Bang, DO  2.5 mg at 01/13/16 0901  . metoprolol succinate (TOPROL-XL) 24 hr tablet 12.5 mg  12.5 mg Oral Daily Nicolette Bang, DO   12.5 mg at 01/13/16 0902  . QUEtiapine (SEROQUEL) tablet 25 mg  25 mg Oral BID Kinnie Feil, MD   25 mg at 01/13/16 1254  . senna-docusate (Senokot-S) tablet 1 tablet  1 tablet Oral QHS PRN Smiley Houseman, MD         Discharge Medications: Please see discharge summary for a list of discharge medications.  Relevant Imaging Results:  Relevant Lab Results:   Additional Information DISCHARGE MEDICATIONS: Stop taking these medications: HYDROcodone-acetaminophen 5-325 MG tablet.   TAKE THESE MEDICATIONS: aspirin 81 MG EC  tablet Take 1 tablet (81 mg total) by mouth daily.  atorvastatin 40 MG tablet Commonly known as:  LIPITOR Take 1 tablet (40 mg total) by mouth daily at 6 PM.   cephALEXin 500 MG capsule Commonly known as:  KEFLEX Take 1 capsule (500 mg total) by mouth every 12 (twelve) hours.  clonazePAM 0.5 MG disintegrating tablet Commonly known as:  KLONOPIN Take 1 tablet (0.5 mg total) by mouth 2 (two) times daily. What changed:  how much to take   gabapentin 100 MG capsule Commonly known as:  NEURONTIN Take 100 mg by mouth at bedtime. For pain  lisinopril 2.5 MG tablet Commonly known as:  PRINIVIL,ZESTRIL Take 2.5 mg by mouth daily.  metoprolol succinate 25 MG 24 hr tablet Commonly known as:  TOPROL-XL Take 12.5 mg by mouth daily.  psyllium 0.52 g capsule Commonly known as:  REGULOID Take 0.52 g by mouth daily.  sertraline 25 MG tablet Commonly known as:  ZOLOFT Take 25 mg by mouth every morning. anxiety       Sharlet Salina, Mila Homer, LCSW

## 2016-01-23 NOTE — Progress Notes (Signed)
Late entry for missed g-code    01/11/16 1702  OT Time Calculation  OT Start Time (ACUTE ONLY) 1644  OT Stop Time (ACUTE ONLY) 1653  OT Time Calculation (min) 9 min  OT G-codes **NOT FOR INPATIENT CLASS**  Functional Assessment Tool Used Clinical judgement  Functional Limitation Self care  Self Care Current Status (R4935) CI  Self Care Goal Status (L2174) CI  Self Care Discharge Status (J1595) CI  OT General Charges  $OT Visit 1 Procedure  OT Evaluation  $OT Eval Moderate Complexity 1 Procedure    Truman Hayward M.S., OTR/L Pager: [phone]

## 2016-01-25 NOTE — Progress Notes (Signed)
Physical Therapy Progress Note Addendum for G-codes    01-12-2016 1332  PT Visit Information  Last PT Received On 26-Jan-2016  PT G-Codes **NOT FOR INPATIENT CLASS**  Functional Assessment Tool Used Clinical judgement  Functional Limitation Mobility: Walking and moving around  Mobility: Walking and Moving Around Current Status (K9574) CI  Mobility: Walking and Moving Around Goal Status [phone]) CI  Mobility: Walking and Moving Around Discharge Status [phone]) CI  Carita Pian. Sanjuana Kava, [place] Pager [phone]

## 2017-10-19 IMAGING — MR MR HEAD W/O CM
8 of 10 series · 32 of 48 positions shown · non-contrast
Comparison: CT head 01/10/2016

CLINICAL DATA: Facial droop and slurred speech. History of lung
cancer.

EXAM:
MRI HEAD WITHOUT CONTRAST
MRA HEAD WITHOUT CONTRAST
TECHNIQUE: Multiplanar, multiecho pulse sequences of the brain and surrounding
structures were obtained without intravenous contrast. Angiographic
images of the head were obtained using MRA technique without
contrast.

[Series 3: DWI · axial · 3.0mm · 1.09mm/px · z∈[-69,+55]mm · 8 of 96 slices shown (1 of 4)]
[im 1/96]
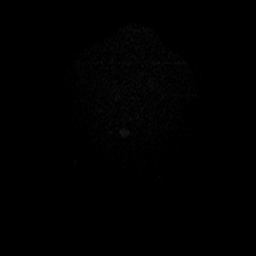
[im 14/96]
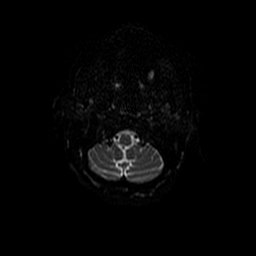
[im 28/96]
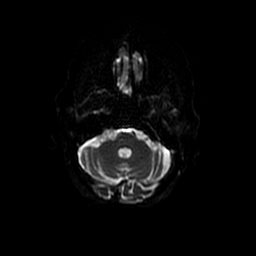
[im 41/96]
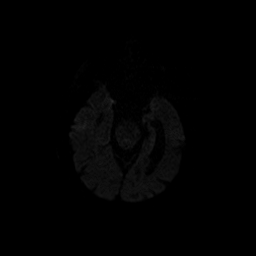
[im 55/96]
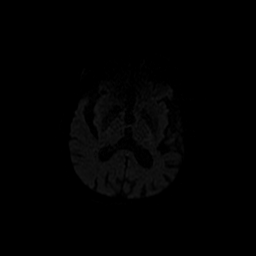
[im 68/96]
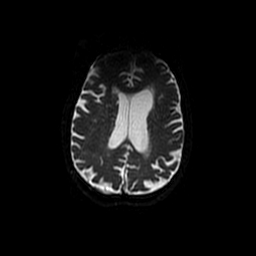
[im 82/96]
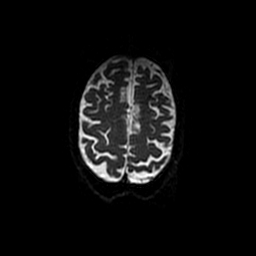
[im 96/96]
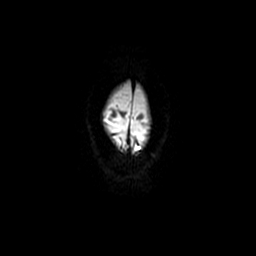

[Series 4: T1 · sagittal · 5.0mm · 0.47mm/px · 2 of 22 slices shown]
[im 1/22]
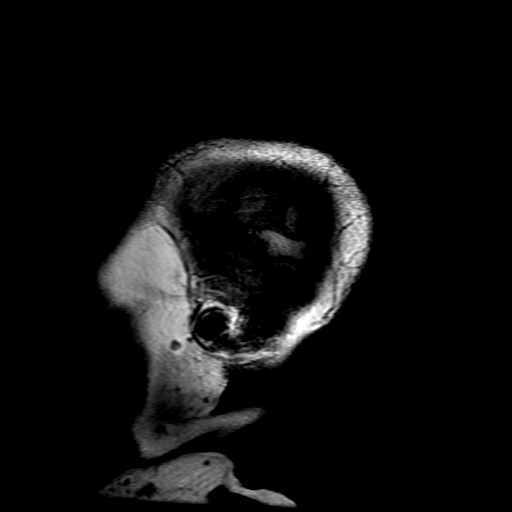
[im 22/22]
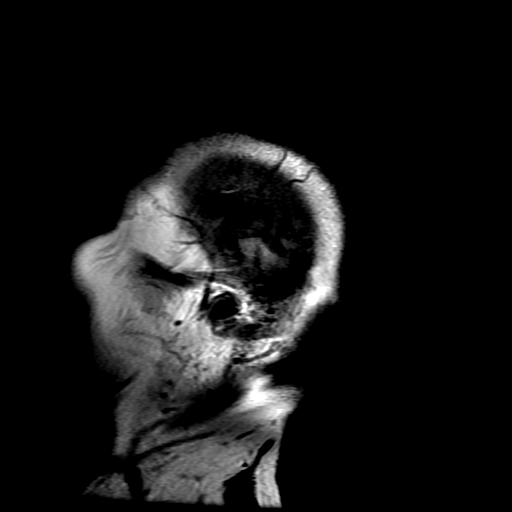

[Series 5: (id) mt fs · axial · 1.4mm · 0.43mm/px · z∈[-39,+27]mm · 6 of 136 slices shown]
[im 1/136]
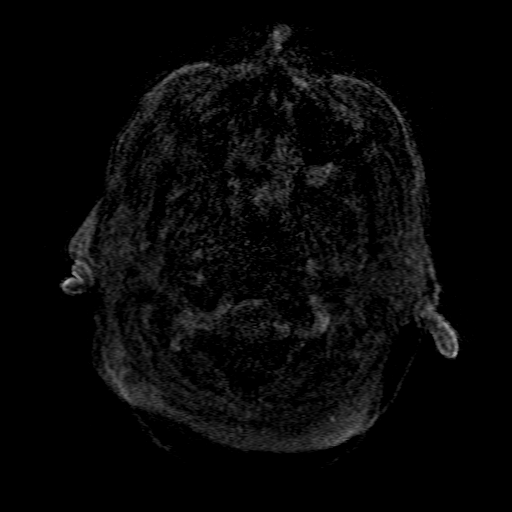
[im 25/136]
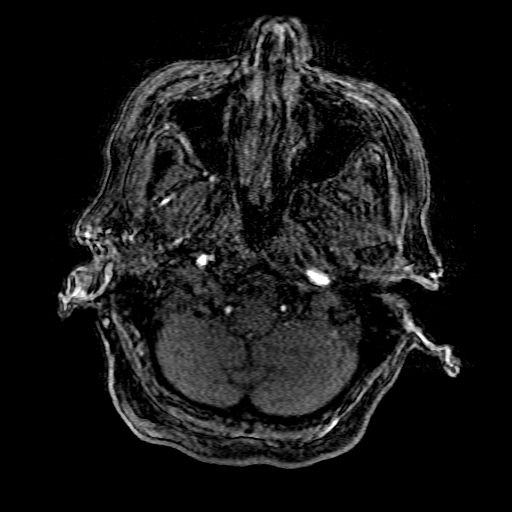
[im 37/136]
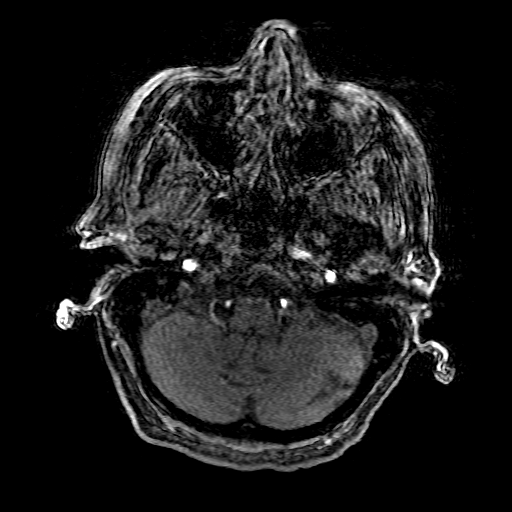
[im 62/136]
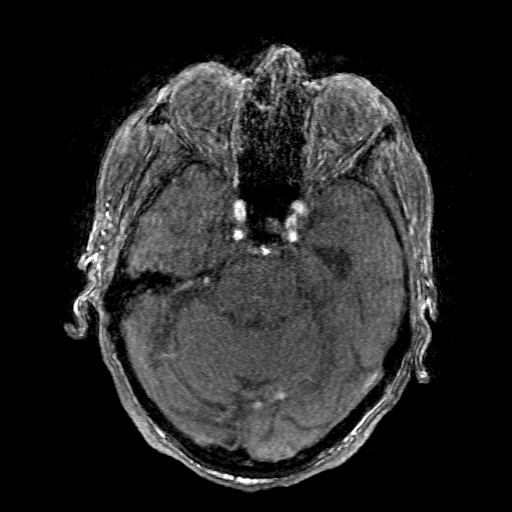
[im 74/136]
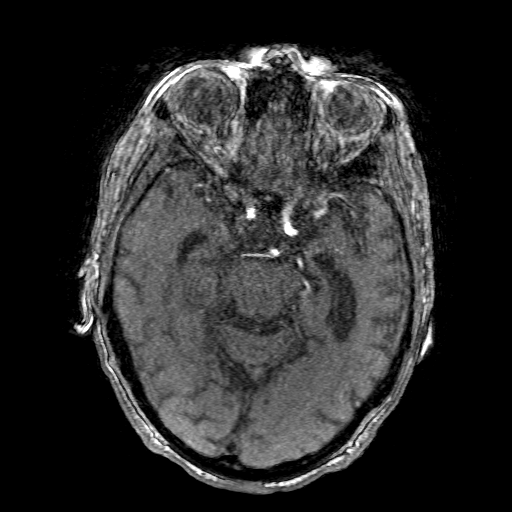
[im 99/136]
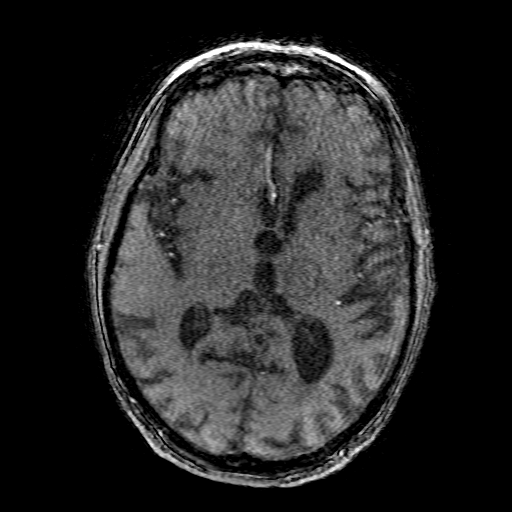

[Series 6: T2 · axial · 5.0mm · 0.43mm/px · z∈[-33,+101]mm · 2 of 24 slices shown]
[im 1/24]
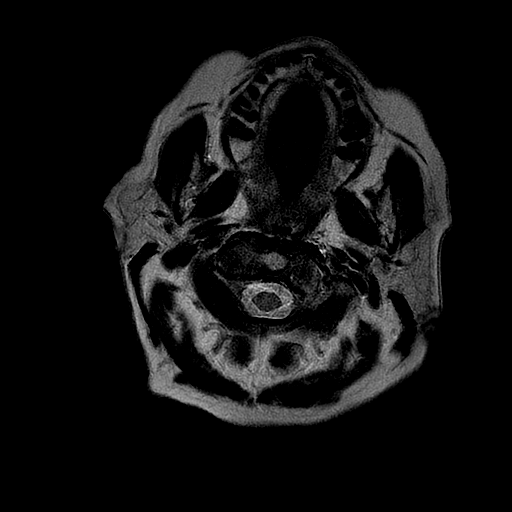
[im 24/24]
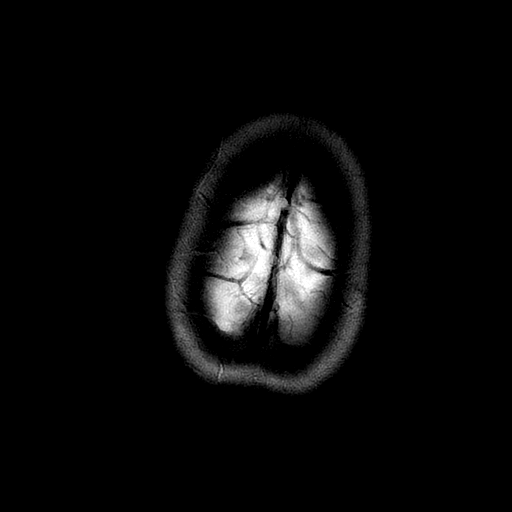

[Series 7: FLAIR · axial · 5.0mm · 0.43mm/px · z∈[-33,+101]mm · 2 of 24 slices shown]
[im 1/24]
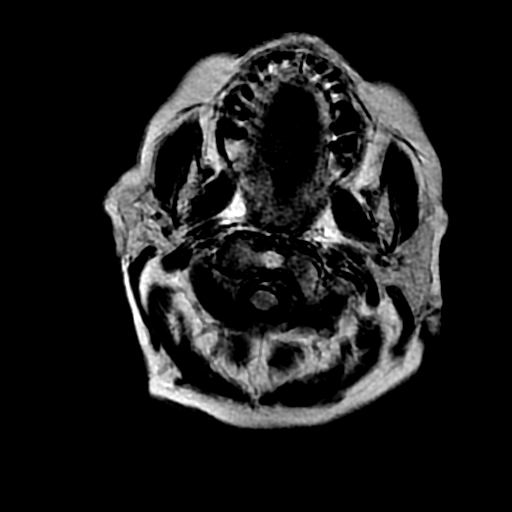
[im 24/24]
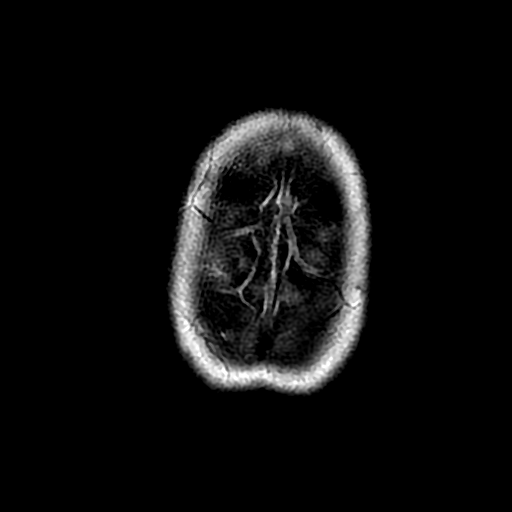

[Series 10: DWI · coronal · 5.0mm · 1.09mm/px · 5 of 64 slices shown (2 of 4)]
[im 1/64]
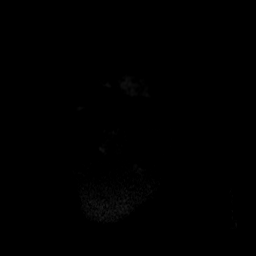
[im 16/64]
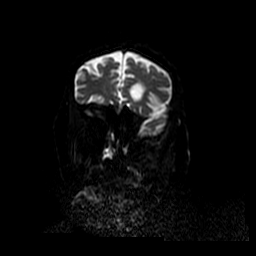
[im 32/64]
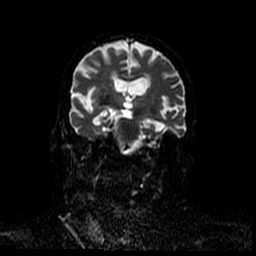
[im 48/64]
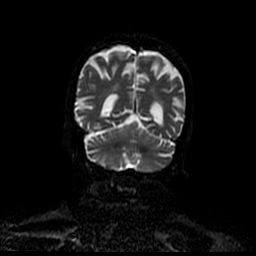
[im 64/64]
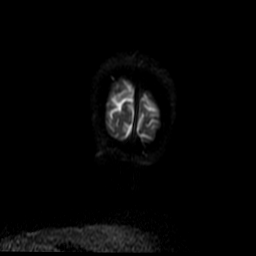

[Series 300: DWI · axial · 3.0mm · 1.09mm/px · z∈[-69,+55]mm · 4 of 48 slices shown (3 of 4)]
[im 1/48]
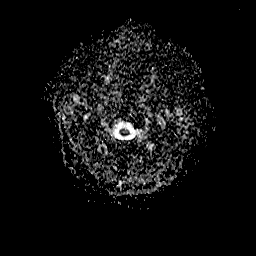
[im 16/48]
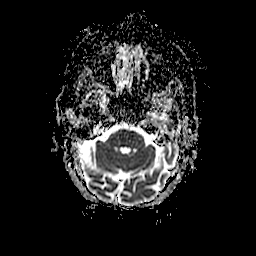
[im 32/48]
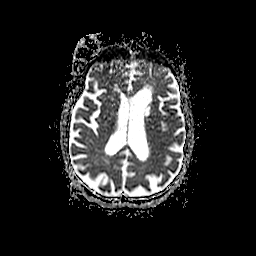
[im 48/48]
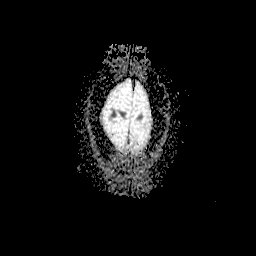

[Series 1000: DWI · coronal · 5.0mm · 1.09mm/px · 3 of 32 slices shown (4 of 4)]
[im 1/32]
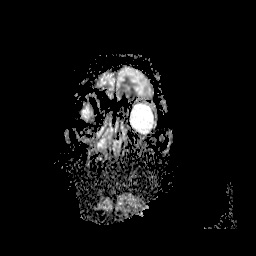
[im 16/32]
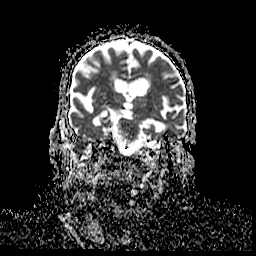
[im 32/32]
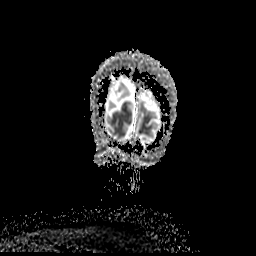

[32 of 48 positions shown; findings below may reference images not displayed]

FINDINGS: MRI HEAD FINDINGS

Image quality degraded by motion.

Moderate atrophy.

Negative for acute infarct

Chronic infarct in the head of the caudate lobe on the left. Chronic
microvascular ischemic change in the white matter and pons.

Negative for hemorrhage or fluid collection.

Negative for mass or edema.  No shift of the midline structures.

Mucosal edema paranasal sinuses. Bilateral cataract extraction.
Pituitary normal in size.

MRA HEAD FINDINGS

Image quality degraded by moderate motion.

Both vertebral arteries patent to the basilar. PICA patent
bilaterally. Basilar appears diffusely diseased without significant
stenosis. Posterior cerebral arteries appear diffusely diseased with
moderate to severe stenosis on the right and moderate stenosis on
the left

Internal carotid artery patent bilaterally without significant
stenosis. Multiple areas of decreased signal throughout the middle
cerebral artery branches bilaterally compatible with atherosclerotic
disease. Mild to moderate stenosis left M1 segment. Irregularity of
the anterior cerebral arteries bilaterally consistent with
atherosclerotic disease

Negative for cerebral aneurysm
IMPRESSION: Atrophy and chronic microvascular ischemia. No acute intracranial
abnormality

MRA degraded by motion. There appears to be significant intracranial
stenosis involving the anterior cerebral arteries, middle cerebral
arteries, and posterior cerebral arteries bilaterally.

## 2017-10-19 IMAGING — CT CT HEAD W/O CM
4 series · 16 of 47 positions shown, 18 images · non-contrast
Comparison: 02/15/2013

CLINICAL DATA: Slurred speech and facial droop. Last seen normal
yesterday. History of lung carcinoma and dementia.

EXAM:
CT HEAD WITHOUT CONTRAST
TECHNIQUE: Contiguous axial images were obtained from the base of the skull
through the vertex without intravenous contrast.

[Series 2: head without · axial · non-contrast · 0.45mm/px · z∈[-102,+3]mm · 7 of 29 slices shown, 9 images]
[im 4/29  brain]
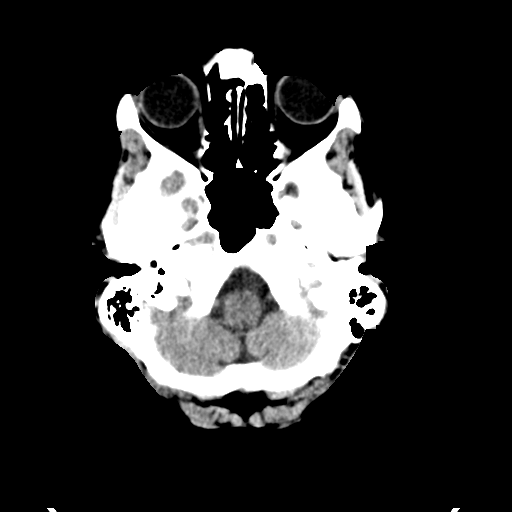
[im 4/29  bone]
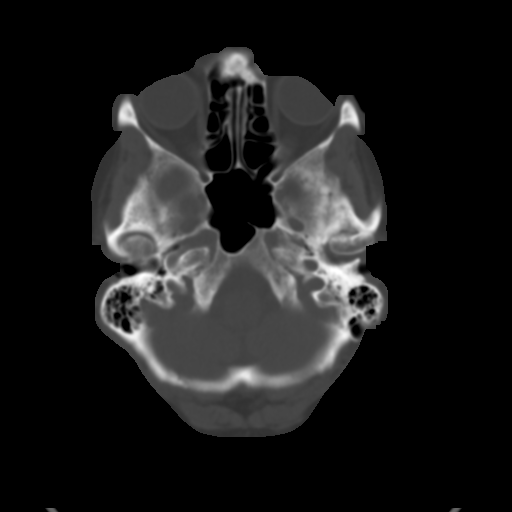
[im 8/29  brain]
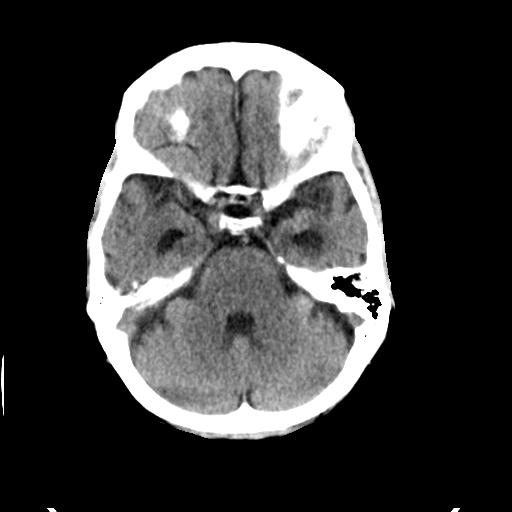
[im 11/29  brain]
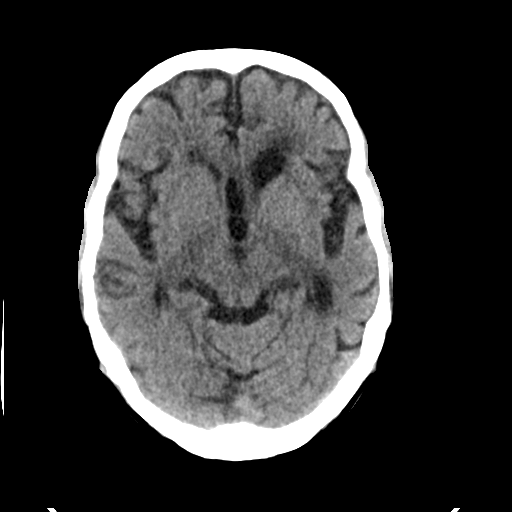
[im 15/29  brain]
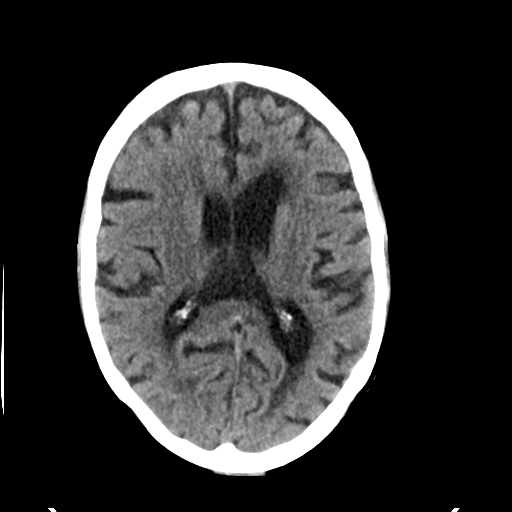
[im 18/29  brain]
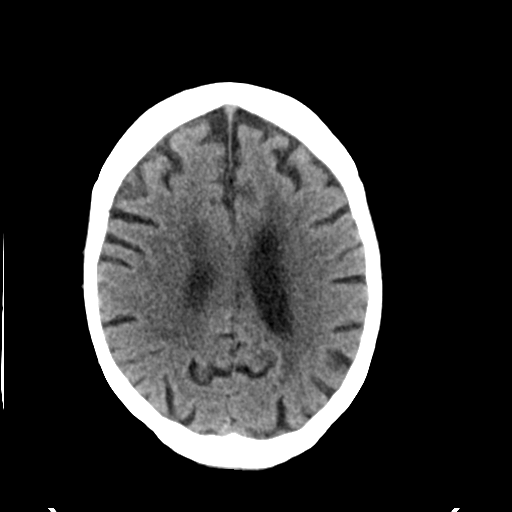
[im 18/29  bone]
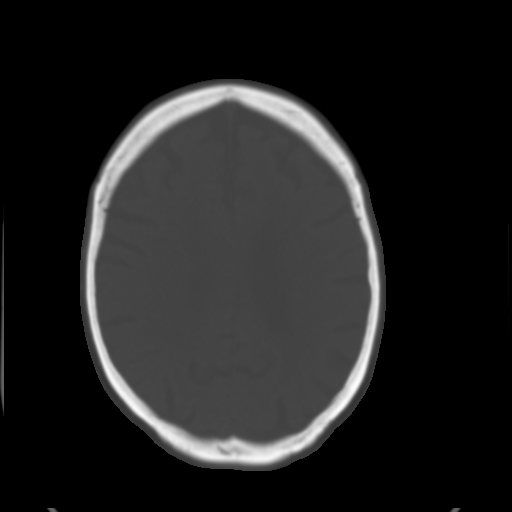
[im 22/29  brain]
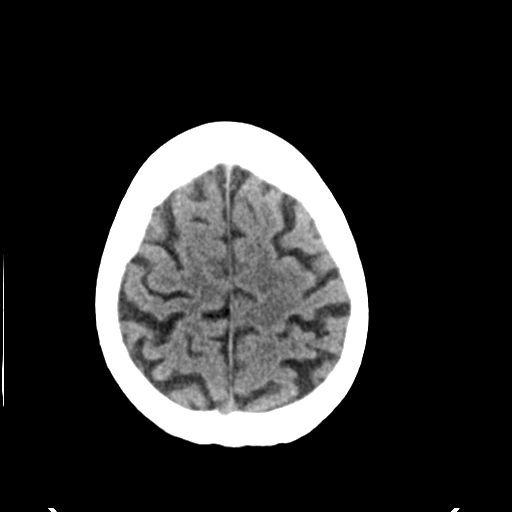
[im 25/29  brain]
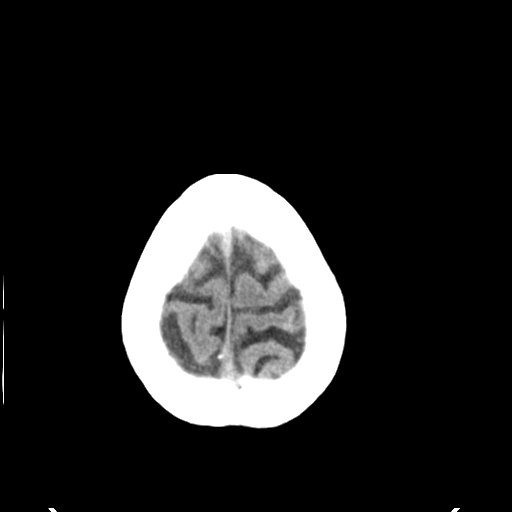

[Series 3: head bone · axial · 0.45mm/px · z∈[-103,-75]mm · 3 of 73 slices shown]
[im 8/73  bone]
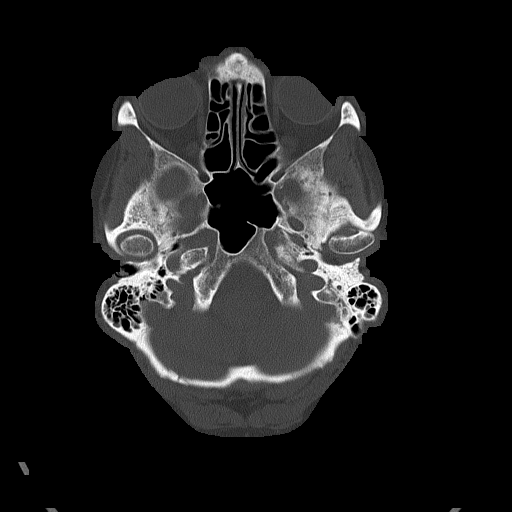
[im 15/73  bone]
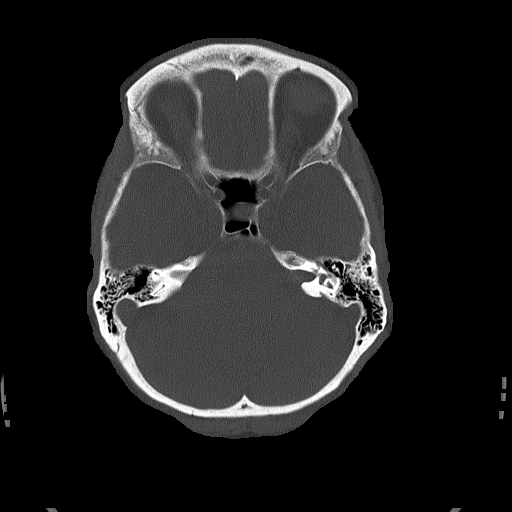
[im 22/73  bone]
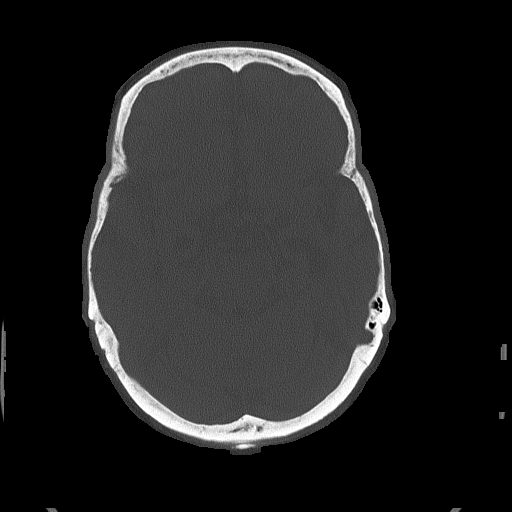

[Series 4: head without cor · coronal · non-contrast · 0.28mm/px · 3 of 61 slices shown]
[im 21/61  brain]
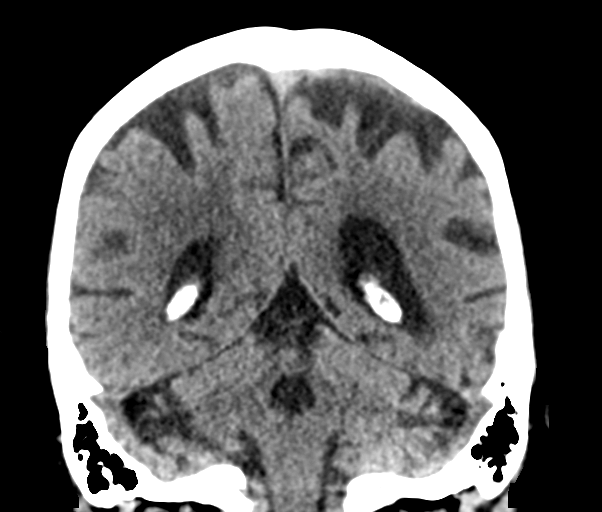
[im 27/61  brain]
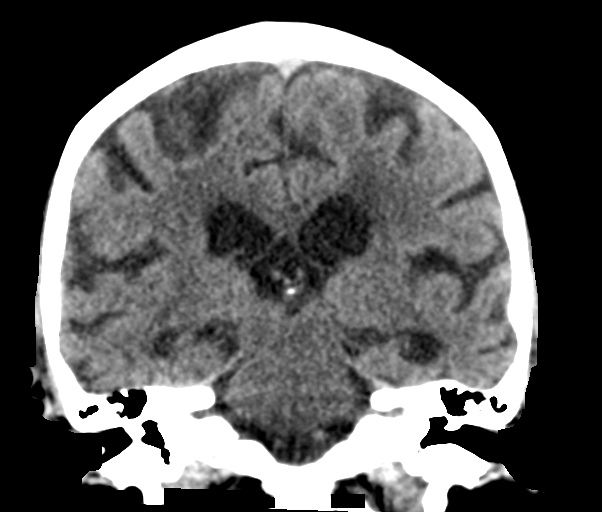
[im 34/61  brain]
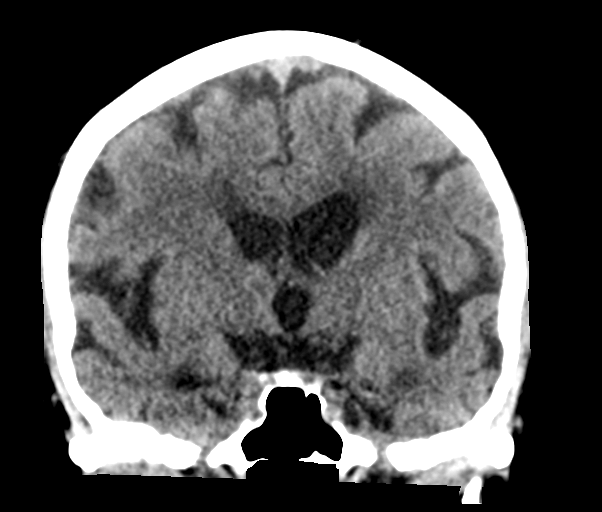

[Series 5: head without sag · sagittal · non-contrast · 0.29mm/px · 3 of 48 slices shown]
[im 16/48  brain]
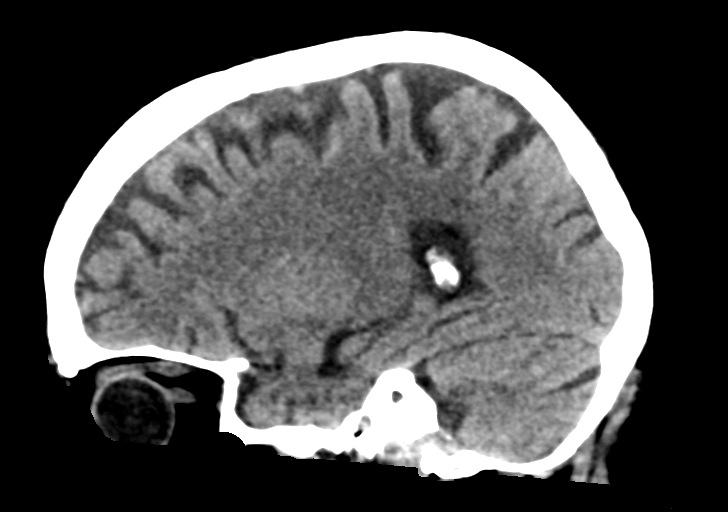
[im 24/48  brain]
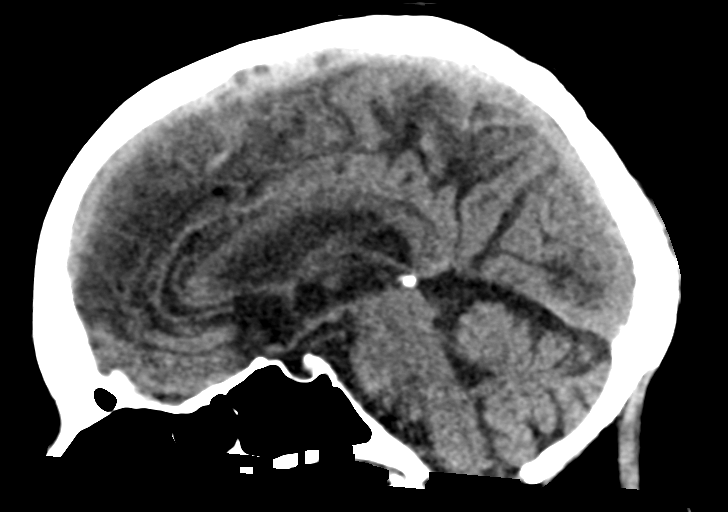
[im 32/48  brain]
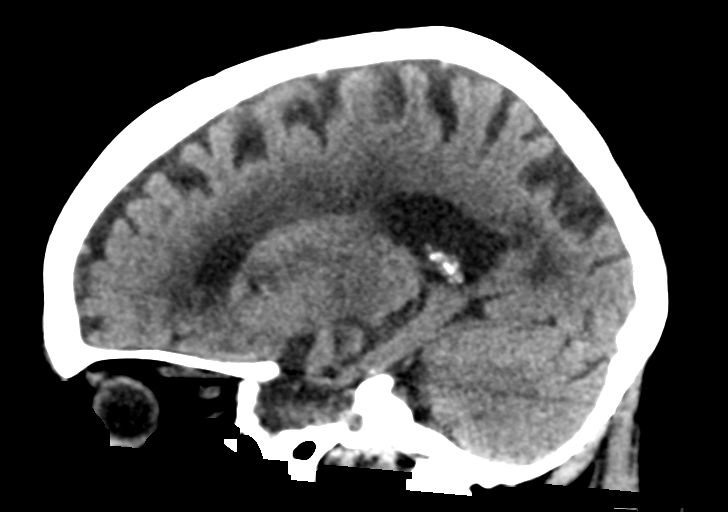

[16 of 47 positions shown; findings below may reference images not displayed]

FINDINGS: The ventricles are normal in configuration.

There is ventricular and sulcal enlargement reflecting moderate
atrophy.

There are no parenchymal masses or mass effect. There is an old
lacune infarct in the left caudate nucleus head. There is no
evidence of a recent infarct. Patchy areas of white matter
hypoattenuation noted consistent with mild chronic microvascular
ischemic change.

There are no extra-axial masses or abnormal fluid collections.

There is no intracranial hemorrhage.

The visualized sinuses and mastoid air cells are clear. No skull
lesion.
IMPRESSION: 1. No acute intracranial abnormalities.
2. Atrophy, old left caudate nucleus lacune infarct and mild chronic
microvascular ischemic change.

## 2020-04-23 ENCOUNTER — Other Ambulatory Visit: Payer: Self-pay

## 2020-04-23 ENCOUNTER — Emergency Department (HOSPITAL_COMMUNITY)
Admission: EM | Admit: 2020-04-23 | Discharge: 2020-04-24 | Disposition: A | Discharge status: Home / Self Care | Payer: Medicare Other | Attending: Emergency Medicine | Admitting: Emergency Medicine

## 2020-04-23 ENCOUNTER — Encounter (HOSPITAL_COMMUNITY): Payer: Self-pay

## 2020-04-23 DIAGNOSIS — R112 Nausea with vomiting, unspecified: Secondary | ICD-10-CM

## 2020-04-23 DIAGNOSIS — F0390 Unspecified dementia without behavioral disturbance: Secondary | ICD-10-CM | POA: Diagnosis not present

## 2020-04-23 DIAGNOSIS — Z79899 Other long term (current) drug therapy: Secondary | ICD-10-CM | POA: Insufficient documentation

## 2020-04-23 DIAGNOSIS — K449 Diaphragmatic hernia without obstruction or gangrene: Secondary | ICD-10-CM | POA: Insufficient documentation

## 2020-04-23 DIAGNOSIS — S161XXA Strain of muscle, fascia and tendon at neck level, initial encounter: Secondary | ICD-10-CM | POA: Insufficient documentation

## 2020-04-23 DIAGNOSIS — S0101XA Laceration without foreign body of scalp, initial encounter: Secondary | ICD-10-CM | POA: Insufficient documentation

## 2020-04-23 DIAGNOSIS — Z20822 Contact with and (suspected) exposure to covid-19: Secondary | ICD-10-CM | POA: Insufficient documentation

## 2020-04-23 DIAGNOSIS — Z85118 Personal history of other malignant neoplasm of bronchus and lung: Secondary | ICD-10-CM | POA: Diagnosis not present

## 2020-04-23 DIAGNOSIS — I10 Essential (primary) hypertension: Secondary | ICD-10-CM | POA: Diagnosis not present

## 2020-04-23 DIAGNOSIS — Z23 Encounter for immunization: Secondary | ICD-10-CM | POA: Diagnosis not present

## 2020-04-23 DIAGNOSIS — K920 Hematemesis: Secondary | ICD-10-CM | POA: Diagnosis present

## 2020-04-23 NOTE — ED Triage Notes (Signed)
Pt arrived via [facility] from [place] facility. Pt began vomiting blood around 2130. EMS reports coffee ground emesis. Pt has a hx of anemia and a hernia.   20G left hand  EMS gave 4mg  zofran and 150 cc of NS  Vitals from EMS:  BP: 146/64 CBG: 140 HR: 80 O2: 94%

## 2020-04-24 ENCOUNTER — Encounter (HOSPITAL_COMMUNITY): Payer: Self-pay

## 2020-04-24 ENCOUNTER — Emergency Department (HOSPITAL_COMMUNITY): Payer: Medicare Other

## 2020-04-24 ENCOUNTER — Emergency Department (HOSPITAL_COMMUNITY)
Admission: EM | Admit: 2020-04-24 | Discharge: 2020-04-25 | Disposition: A | Discharge status: Home / Self Care | Payer: Medicare Other | Source: Home / Self Care | Attending: Emergency Medicine | Admitting: Emergency Medicine

## 2020-04-24 DIAGNOSIS — K449 Diaphragmatic hernia without obstruction or gangrene: Secondary | ICD-10-CM | POA: Diagnosis not present

## 2020-04-24 DIAGNOSIS — S161XXA Strain of muscle, fascia and tendon at neck level, initial encounter: Secondary | ICD-10-CM | POA: Insufficient documentation

## 2020-04-24 DIAGNOSIS — F0390 Unspecified dementia without behavioral disturbance: Secondary | ICD-10-CM | POA: Insufficient documentation

## 2020-04-24 DIAGNOSIS — S0101XA Laceration without foreign body of scalp, initial encounter: Secondary | ICD-10-CM

## 2020-04-24 DIAGNOSIS — Z79899 Other long term (current) drug therapy: Secondary | ICD-10-CM | POA: Insufficient documentation

## 2020-04-24 DIAGNOSIS — W19XXXA Unspecified fall, initial encounter: Secondary | ICD-10-CM

## 2020-04-24 DIAGNOSIS — Z23 Encounter for immunization: Secondary | ICD-10-CM | POA: Insufficient documentation

## 2020-04-24 DIAGNOSIS — I10 Essential (primary) hypertension: Secondary | ICD-10-CM | POA: Insufficient documentation

## 2020-04-24 DIAGNOSIS — W16022A Fall into swimming pool striking bottom causing other injury, initial encounter: Secondary | ICD-10-CM | POA: Insufficient documentation

## 2020-04-24 DIAGNOSIS — Y9389 Activity, other specified: Secondary | ICD-10-CM | POA: Insufficient documentation

## 2020-04-24 DIAGNOSIS — Z85118 Personal history of other malignant neoplasm of bronchus and lung: Secondary | ICD-10-CM | POA: Insufficient documentation

## 2020-04-24 DIAGNOSIS — Y92016 Swimming-pool in single-family (private) house or garden as the place of occurrence of the external cause: Secondary | ICD-10-CM | POA: Insufficient documentation

## 2020-04-24 LAB — PROTIME-INR
INR: 1 (ref 0.8–1.2)
INR: 1.1 (ref 0.8–1.2)
Prothrombin Time: 13.2 seconds (ref 11.4–15.2)
Prothrombin Time: 13.7 seconds (ref 11.4–15.2)

## 2020-04-24 LAB — RESP PANEL BY RT-PCR (FLU A&B, COVID) ARPGX2
Influenza A by PCR: NEGATIVE
Influenza B by PCR: NEGATIVE
SARS Coronavirus 2 by RT PCR: NEGATIVE

## 2020-04-24 LAB — CBC WITH DIFFERENTIAL/PLATELET
Abs Immature Granulocytes: 0.06 10*3/uL (ref 0.00–0.07)
Abs Immature Granulocytes: 0.07 10*3/uL (ref 0.00–0.07)
Basophils Absolute: 0 10*3/uL (ref 0.0–0.1)
Basophils Absolute: 0.1 10*3/uL (ref 0.0–0.1)
Basophils Relative: 0 %
Basophils Relative: 0 %
Eosinophils Absolute: 0 10*3/uL (ref 0.0–0.5)
Eosinophils Absolute: 0.2 10*3/uL (ref 0.0–0.5)
Eosinophils Relative: 0 %
Eosinophils Relative: 1 %
HCT: 38.1 % (ref 36.0–46.0)
HCT: 40.7 % (ref 36.0–46.0)
Hemoglobin: 11.7 g/dL — ABNORMAL LOW (ref 12.0–15.0)
Hemoglobin: 12.7 g/dL (ref 12.0–15.0)
Immature Granulocytes: 0 %
Immature Granulocytes: 0 %
Lymphocytes Relative: 10 %
Lymphocytes Relative: 14 %
Lymphs Abs: 1.6 10*3/uL (ref 0.7–4.0)
Lymphs Abs: 2.3 10*3/uL (ref 0.7–4.0)
MCH: 27.1 pg (ref 26.0–34.0)
MCH: 27.2 pg (ref 26.0–34.0)
MCHC: 30.7 g/dL (ref 30.0–36.0)
MCHC: 31.2 g/dL (ref 30.0–36.0)
MCV: 87.2 fL (ref 80.0–100.0)
MCV: 88.4 fL (ref 80.0–100.0)
Monocytes Absolute: 0.5 10*3/uL (ref 0.1–1.0)
Monocytes Absolute: 0.8 10*3/uL (ref 0.1–1.0)
Monocytes Relative: 3 %
Monocytes Relative: 5 %
Neutro Abs: 12.7 10*3/uL — ABNORMAL HIGH (ref 1.7–7.7)
Neutro Abs: 13.2 10*3/uL — ABNORMAL HIGH (ref 1.7–7.7)
Neutrophils Relative %: 80 %
Neutrophils Relative %: 87 %
Platelets: 186 10*3/uL (ref 150–400)
Platelets: 191 10*3/uL (ref 150–400)
RBC: 4.31 MIL/uL (ref 3.87–5.11)
RBC: 4.67 MIL/uL (ref 3.87–5.11)
RDW: 13.2 % (ref 11.5–15.5)
RDW: 13.5 % (ref 11.5–15.5)
WBC: 15.4 10*3/uL — ABNORMAL HIGH (ref 4.0–10.5)
WBC: 16 10*3/uL — ABNORMAL HIGH (ref 4.0–10.5)
nRBC: 0 % (ref 0.0–0.2)
nRBC: 0 % (ref 0.0–0.2)

## 2020-04-24 LAB — COMPREHENSIVE METABOLIC PANEL
ALT: 16 U/L (ref 0–44)
ALT: 14 U/L (ref 0–44)
AST: 17 U/L (ref 15–41)
AST: 15 U/L (ref 15–41)
Albumin: 3.8 g/dL (ref 3.5–5.0)
Albumin: 3.2 g/dL — ABNORMAL LOW (ref 3.5–5.0)
Alkaline Phosphatase: 77 U/L (ref 38–126)
Alkaline Phosphatase: 70 U/L (ref 38–126)
Anion gap: 10 (ref 5–15)
Anion gap: 11 (ref 5–15)
BUN: 24 mg/dL — ABNORMAL HIGH (ref 8–23)
BUN: 20 mg/dL (ref 8–23)
CO2: 26 mmol/L (ref 22–32)
CO2: 23 mmol/L (ref 22–32)
Calcium: 9.3 mg/dL (ref 8.9–10.3)
Calcium: 9.3 mg/dL (ref 8.9–10.3)
Chloride: 109 mmol/L (ref 98–111)
Chloride: 109 mmol/L (ref 98–111)
Creatinine, Ser: 1.17 mg/dL — ABNORMAL HIGH (ref 0.44–1.00)
Creatinine, Ser: 1.13 mg/dL — ABNORMAL HIGH (ref 0.44–1.00)
GFR, Estimated: 45 mL/min — ABNORMAL LOW (ref >60)
GFR, Estimated: 47 mL/min — ABNORMAL LOW (ref >60)
Glucose, Bld: 173 mg/dL — ABNORMAL HIGH (ref 70–99)
Glucose, Bld: 118 mg/dL — ABNORMAL HIGH (ref 70–99)
Potassium: 4.1 mmol/L (ref 3.5–5.1)
Potassium: 4.1 mmol/L (ref 3.5–5.1)
Sodium: 145 mmol/L (ref 135–145)
Sodium: 143 mmol/L (ref 135–145)
Total Bilirubin: 0.7 mg/dL (ref 0.3–1.2)
Total Bilirubin: 0.9 mg/dL (ref 0.3–1.2)
Total Protein: 6.4 g/dL — ABNORMAL LOW (ref 6.5–8.1)
Total Protein: 5.6 g/dL — ABNORMAL LOW (ref 6.5–8.1)

## 2020-04-24 LAB — TYPE AND SCREEN
ABO/RH(D): AB POS
ABO/RH(D): AB POS
Antibody Screen: NEGATIVE
Antibody Screen: NEGATIVE

## 2020-04-24 LAB — CK: Total CK: 137 U/L (ref 38–234)

## 2020-04-24 LAB — LIPASE, BLOOD: Lipase: 33 U/L (ref 11–51)

## 2020-04-24 MED ORDER — PANTOPRAZOLE SODIUM 40 MG IV SOLR: 40.0000 mg | Freq: Two times a day (BID) | INTRAVENOUS | Status: DC

## 2020-04-24 MED ORDER — IOHEXOL 300 MG/ML  SOLN
100.0000 mL | Freq: Once | INTRAMUSCULAR | Status: AC | PRN
  Administered 2020-04-24: 80 mL via INTRAVENOUS

## 2020-04-24 MED ORDER — LIDOCAINE-EPINEPHRINE 1 %-1:100000 IJ SOLN
INTRAMUSCULAR | Status: AC
  Filled 2020-04-24: qty 1

## 2020-04-24 MED ORDER — SODIUM CHLORIDE 0.9 % IV SOLN
80.0000 mg | Freq: Once | INTRAVENOUS | Status: AC
  Administered 2020-04-24: 80 mg via INTRAVENOUS
  Filled 2020-04-24: qty 80

## 2020-04-24 MED ORDER — SODIUM CHLORIDE (PF) 0.9 % IJ SOLN
INTRAMUSCULAR | Status: AC
  Filled 2020-04-24: qty 50

## 2020-04-24 MED ORDER — TETANUS-DIPHTH-ACELL PERTUSSIS 5-2.5-18.5 LF-MCG/0.5 IM SUSY
0.5000 mL | PREFILLED_SYRINGE | Freq: Once | INTRAMUSCULAR | Status: AC
  Administered 2020-04-24: 0.5 mL via INTRAMUSCULAR
  Filled 2020-04-24: qty 0.5

## 2020-04-24 MED ORDER — BUPIVACAINE HCL (PF) 0.5 % IJ SOLN
10.0000 mL | Freq: Once | INTRAMUSCULAR | Status: AC
  Administered 2020-04-24: 10 mL
  Filled 2020-04-24: qty 10

## 2020-04-24 MED ORDER — HALOPERIDOL LACTATE 5 MG/ML IJ SOLN
2.0000 mg | Freq: Once | INTRAMUSCULAR | Status: AC
  Administered 2020-04-24: 2 mg via INTRAVENOUS
  Filled 2020-04-24: qty 1

## 2020-04-24 MED ORDER — ONDANSETRON HCL 4 MG/2ML IJ SOLN
4.0000 mg | Freq: Once | INTRAMUSCULAR | Status: DC
  Filled 2020-04-24: qty 2

## 2020-04-24 MED ORDER — SODIUM CHLORIDE 0.9 % IV SOLN
8.0000 mg/h | INTRAVENOUS | Status: DC
  Administered 2020-04-24: 8 mg/h via INTRAVENOUS
  Filled 2020-04-24: qty 80

## 2020-04-24 MED ORDER — RISPERIDONE 0.5 MG PO TABS: 0.2500 mg | ORAL_TABLET | Freq: Once | ORAL | Status: DC

## 2020-04-24 NOTE — ED Notes (Signed)
Attempted 2 more IV starts with no success.

## 2020-04-24 NOTE — ED Notes (Signed)
Patient still has soft restraints on her wrists if needed but is not currently restrained. Will continue to monitor.

## 2020-04-24 NOTE — ED Notes (Signed)
While NT attempted to clean pts face, pt stated "leave it alone. Don't touch it anymore". RN notified.

## 2020-04-24 NOTE — Discharge Instructions (Addendum)
You were seen in the ED today after vomiting. Your labs are reassuring. Your CT scan showed a large hiatal hernia which can lead to some vomiting. Your blood counts were normal. Please follow up closely with PCP and return to the ED with any new or worsening symptoms.

## 2020-04-24 NOTE — ED Notes (Signed)
This nurse checked on the patient and she had again taken off her blood pressure cuff, pulse ox, and EKG leads. Pt is attempting to take off her armband and still trying to sit up to scoot out of the bed.

## 2020-04-24 NOTE — ED Notes (Signed)
Pt to CT

## 2020-04-24 NOTE — ED Notes (Signed)
Patient got out of mittens and was attempting to get up again. This nurse explained that the patient needed to stay in the bed and she refused. Pt was placed in soft restraints per order.

## 2020-04-24 NOTE — ED Notes (Signed)
[employer] 1% [employer] PA [employer]

## 2020-04-24 NOTE — ED Notes (Signed)
This nurse found this patient trying to get out of the bed with her legs hanging over the side rails. Pt had pulled off her blood pressure cuff and EKG leads and pulled out her only working IV and had blood all over her. Pt was cleaned up by this nurse and Opal Sidles, NT, and educated to stay in the bed. The bed alarm is on. The patient is wearing yellow socks and a fall risk armband.

## 2020-04-24 NOTE — ED Notes (Signed)
No one available to give report to at facility.

## 2020-04-24 NOTE — ED Triage Notes (Signed)
Pt BIB [facility] from [facility] for eval of fall. Unsure LOC but found on floor, pt A&O1, is at baseline. EMS reports avulsion to L head/forehead. Pt was seen at [facility] yesterday for possible GI bleed. EDP at bedside on arrival  18 LAC VSS

## 2020-04-24 NOTE — H&P (Signed)
In error

## 2020-04-24 NOTE — ED Notes (Signed)
Attempted to contact [facility] two times with no answer. Will try again.

## 2020-04-24 NOTE — ED Notes (Signed)
[facility] called, spoke with Jeanne Compton

## 2020-04-24 NOTE — ED Provider Notes (Signed)
Emergency Department Provider Note   I have reviewed the triage vital signs and the nursing notes.   HISTORY  Chief Complaint Hematemesis   HPI Jeanne Compton is a 86 y.o. female with past medical history reviewed below including dementia presents to the emergency department from [facility] after reported vomiting blood this evening. Patient arrives by EMS who report patient had coffee-ground emesis material in her vomit this evening. Patient does not recall vomiting with her dementia. She denies any active abdominal pain, chest pain, shortness of breath. EMS gave IV Zofran along with 150 mL of normal saline and transported to the emergency department with stable vital signs. No additional vomiting in route with EMS. In review of the patient's MAR from the facility she is taking aspirin but is not otherwise anticoagulated.  Level 5 caveat: Dementia    Past Medical History:  Diagnosis Date  . Azotemia   . Dementia ([place])   . Hx of breast reduction, elective   . Lung cancer ([place])    hx of radiation    Patient Active Problem List   Diagnosis Date Noted  . Dementia ([place])   . Slurred speech   . Essential hypertension   . Facial droop 01/10/2016  . Acute encephalopathy 02/15/2013  . Hyponatremia 02/15/2013  . Hypokalemia 02/15/2013  . Lactic acidosis 02/15/2013  . RBBB 02/15/2013    Past Surgical History:  Procedure Laterality Date  . ABDOMINAL HYSTERECTOMY    . BACK SURGERY    . BREAST REDUCTION SURGERY    . CARPAL TUNNEL RELEASE    . Ct Biopsy of the lung      Allergies Compazine [prochlorperazine]  History reviewed. No pertinent family history.  Social History Social History   Tobacco Use  . Smoking status: Never Smoker  . Smokeless tobacco: Never Used  Substance Use Topics  . Alcohol use: No  . Drug use: No    Review of Systems  Constitutional: No fever/chills Eyes: No visual changes. ENT: No sore throat. Cardiovascular: Denies chest  pain. Respiratory: Denies shortness of breath. Gastrointestinal: No abdominal pain. Vomiting blood per EMS.  No diarrhea.  No constipation. Genitourinary: Negative for dysuria. Musculoskeletal: Negative for back pain. Skin: Negative for rash. Neurological: Negative for headaches, focal weakness or numbness.  10-point ROS otherwise negative.  ____________________________________________   PHYSICAL EXAM:  VITAL SIGNS: ED Triage Vitals  Enc Vitals Group     BP 04/23/20 2333 (!) 171/84     Pulse Rate 04/23/20 2333 86     Resp 04/23/20 2333 (!) 22     Temp 04/23/20 2333 98.8 F (37.1 C)     Temp Source 04/23/20 2333 Oral     SpO2 04/23/20 2333 95 %   Constitutional: Alert and pleasant but confused. Well appearing and in no acute distress. Eyes: Conjunctivae are normal.  Head: Atraumatic. Nose: No congestion/rhinnorhea. Mouth/Throat: Mucous membranes are moist.  Neck: No stridor. Cardiovascular: Normal rate, regular rhythm. Good peripheral circulation. Grossly normal heart sounds.   Respiratory: Normal respiratory effort.  No retractions. Lungs CTAB. Gastrointestinal: Soft and nontender. No distention.  Musculoskeletal: No lower extremity tenderness nor edema. No gross deformities of extremities. Neurologic:  Normal speech and language. No gross focal neurologic deficits are appreciated.  Skin:  Skin is warm, dry and intact. No rash noted.  ____________________________________________   LABS (all labs ordered are listed, but only abnormal results are displayed)  Labs Reviewed  COMPREHENSIVE METABOLIC PANEL - Abnormal; Notable for the following components:  Result Value   Glucose, Bld 173 (*)    BUN 24 (*)    Creatinine, Ser 1.17 (*)    Total Protein 6.4 (*)    GFR, Estimated 45 (*)    All other components within normal limits  CBC WITH DIFFERENTIAL/PLATELET - Abnormal; Notable for the following components:   WBC 15.4 (*)    Neutro Abs 13.2 (*)    All other  components within normal limits  RESP PANEL BY RT-PCR (FLU A&B, COVID) ARPGX2  LIPASE, BLOOD  PROTIME-INR  TYPE AND SCREEN  ABO/RH   ____________________________________________  RADIOLOGY  CT ABDOMEN PELVIS W CONTRAST  Result Date: 04/24/2020 CLINICAL DATA:  Abdominal pain, nausea, and vomiting. Coffee ground emesis. EXAM: CT ABDOMEN AND PELVIS WITH CONTRAST TECHNIQUE: Multidetector CT imaging of the abdomen and pelvis was performed using the standard protocol following bolus administration of intravenous contrast. CONTRAST:  43mL OMNIPAQUE IOHEXOL 300 MG/ML  SOLN COMPARISON:  02/15/2013 FINDINGS: Lower chest: Atelectasis in the lung bases. Large esophageal hiatal hernia with nearly all of the stomach in the chest. Mild cardiac enlargement. Hepatobiliary: Mild diffuse fatty infiltration of the liver. No focal lesions. Gallbladder and bile ducts are unremarkable. Pancreas: Unremarkable. No pancreatic ductal dilatation or surrounding inflammatory changes. Spleen: Normal in size without focal abnormality. Adrenals/Urinary Tract: No adrenal gland nodules. Nephrograms are symmetrical. Bilateral renal cysts, unchanged. No hydronephrosis or hydroureter. Bladder is normal. Stomach/Bowel: Stomach, small bowel, and colon are not abnormally distended. Stool fills the colon. No inflammatory changes. Appendix is normal. Vascular/Lymphatic: Aortic atherosclerosis. No enlarged abdominal or pelvic lymph nodes. Reproductive: Status post hysterectomy. No adnexal masses. Other: No abdominal wall hernia or abnormality. No abdominopelvic ascites. Musculoskeletal: Slight anterior subluxation at L5-S1. Postoperative changes in the lower lumbar spine. Degenerative changes in the spine and hips. IMPRESSION: 1. No acute process demonstrated in the abdomen or pelvis. No evidence of bowel obstruction or inflammation. 2. Large esophageal hiatal hernia with nearly all of the stomach in the chest. 3. Mild diffuse fatty infiltration  of the liver. 4. Bilateral renal cysts. 5. Aortic atherosclerosis. Aortic Atherosclerosis (ICD10-I70.0). Electronically Signed   By: Lucienne Capers M.D.   On: 04/24/2020 05:15    ____________________________________________   PROCEDURES  Procedure(s) performed:   Procedures  None  ____________________________________________   INITIAL IMPRESSION / ASSESSMENT AND PLAN / ED COURSE  Pertinent labs & imaging results that were available during my care of the patient were reviewed by me and considered in my medical decision making (see chart for details).   Patient presents emergency department with report of coffee-ground emesis from her skilled nursing facility this evening. Her vital signs show hypertension but otherwise normal values. Patient is well-appearing and denies abdominal pain. History is very limited at this time given the patient's underlying dementia. She is not anticoagulated. Obtained initial blood work including type and screen. Patient does have leukocytosis along with slight elevated BUN and creatinine although no AKI. Plan for CT abdomen pelvis and have started Protonix infusion. No notes in Epic since 2017 with admit for CVA.   06:50 AM  CT imaging of the abdomen pelvis showed a large hiatal hernia but no other acute findings.  The patient's blood work including hemoglobin are unremarkable.  Patient is looking well and conversational but confused which is her baseline.  Called and discussed the case with the patient's daughter who was unaware the patient was transported to the emergency department.  We discussed the findings of hiatal hernia.  Patient has had no  additional vomiting here in the emergency department.  I feel the patient is ready for discharge back to [facility] and will arrange transport back. Daughter will coordinate with SNF to arrange PCP follow up. Discussed that given symptoms could consider GI follow up for hiatal hernia but given age and medical  co-morbidity would discuss with PCP first.  ____________________________________________  FINAL CLINICAL IMPRESSION(S) / ED DIAGNOSES  Final diagnoses:  Non-intractable vomiting with nausea, unspecified vomiting type  Hiatal hernia    MEDICATIONS GIVEN DURING THIS VISIT:  Medications  pantoprazole (PROTONIX) 80 mg in sodium chloride 0.9 % 100 mL IVPB (0 mg Intravenous Stopped 04/24/20 0510)  sodium chloride (PF) 0.9 % injection (  Given by Other 04/24/20 0558)  iohexol (OMNIPAQUE) 300 MG/ML solution 100 mL (80 mLs Intravenous Contrast Given 04/24/20 0449)  haloperidol lactate (HALDOL) injection 2 mg (2 mg Intravenous Given 04/24/20 0404)    Note:  This document was prepared using Dragon voice recognition software and may include unintentional dictation errors.  Nanda Quinton, MD, [facility] Emergency Medicine    Bryton Waight, [facility], MD 04/24/20 [phone]

## 2020-04-24 NOTE — ED Provider Notes (Signed)
[place] EMERGENCY DEPARTMENT Provider Note   CSN: [id number] Arrival date & time:        History Chief Complaint  Patient presents with  . Fall    Andreanna Mikolajczak is a 86 y.o. female.  HPI  Patient is an 86 year old female with past medical history significant for advanced age dementia with baseline oriented to self, .  She presents to the emergency department from [facility] after a fall that occurred earlier today.  She is found on the ground with small pool of blood around her by EMS she had active bleeding from the left side of her forehead she was oriented to self when she was found she was not postictal and she had been seen approximately 30 minutes prior to the fall in good health without any complaints.  She is able to ambulate at baseline she does usually use a walker.  She is on no blood thinners apart from aspirin.  She denies any pain currently apart from a headache and some neck pain.  She is currently wearing a cervical collar.       Past Medical History:  Diagnosis Date  . Azotemia   . Dementia ([place])   . Hx of breast reduction, elective   . Lung cancer ([place])    hx of radiation    Patient Active Problem List   Diagnosis Date Noted  . Dementia ([place])   . Slurred speech   . Essential hypertension   . Facial droop 01/10/2016  . Acute encephalopathy 02/15/2013  . Hyponatremia 02/15/2013  . Hypokalemia 02/15/2013  . Lactic acidosis 02/15/2013  . RBBB 02/15/2013    Past Surgical History:  Procedure Laterality Date  . ABDOMINAL HYSTERECTOMY    . BACK SURGERY    . BREAST REDUCTION SURGERY    . CARPAL TUNNEL RELEASE    . Ct Biopsy of the lung       OB History   No obstetric history on file.     No family history on file.  Social History   Tobacco Use  . Smoking status: Never Smoker  . Smokeless tobacco: Never Used  Substance Use Topics  . Alcohol use: No  . Drug use: No    Home Medications Prior to Admission  medications   Medication Sig Start Date End Date Taking? Authorizing Provider  aspirin EC 81 MG EC tablet Take 1 tablet (81 mg total) by mouth daily. Patient not taking: Reported on 04/24/2020 01/12/16   Nicolette Bang, DO  atorvastatin (LIPITOR) 40 MG tablet Take 1 tablet (40 mg total) by mouth daily at 6 PM. 01/12/16   Nicolette Bang, DO  cephALEXin (KEFLEX) 500 MG capsule Take 1 capsule (500 mg total) by mouth 2 (two) times daily for 5 days. 04/25/20 04/30/20  Tedd Sias, PA  clonazePAM (KLONOPIN) 0.5 MG disintegrating tablet Take 1 tablet (0.5 mg total) by mouth 2 (two) times daily. Patient taking differently: Take 0.5 mg by mouth 3 (three) times daily.  01/13/16   Veatrice Bourbon, MD  famotidine (PEPCID) 20 MG tablet Take 20 mg by mouth at bedtime.    [provider]  lisinopril (PRINIVIL,ZESTRIL) 2.5 MG tablet Take 2.5 mg by mouth daily.    [provider]  metoprolol succinate (TOPROL-XL) 25 MG 24 hr tablet Take 12.5 mg by mouth daily.    [provider]  psyllium (REGULOID) 0.52 g capsule Take 0.52 g by mouth daily.    [provider]  risperiDONE (RISPERDAL) 0.25 MG tablet Take 0.25 mg by mouth at bedtime.    [provider]  sertraline (ZOLOFT) 100 MG tablet Take 100 mg by mouth at bedtime.    [provider]    Allergies    Compazine [prochlorperazine]  Review of Systems   Review of Systems  Unable to perform ROS: Dementia  Constitutional: Negative for fever.  HENT: Negative for congestion.   Respiratory: Negative for shortness of breath.   Cardiovascular: Negative for chest pain.  Gastrointestinal: Negative for abdominal distention.  Musculoskeletal: Positive for neck pain.  Neurological: Negative for dizziness and headaches.    Physical Exam Updated Vital Signs BP 128/61 (BP Location: Right Arm)   Pulse 78   Temp 97.7 F (36.5 C) (Temporal)   Resp 18   Ht 5\' 3"  (1.6 m)   Wt 71.6 kg    SpO2 92%   BMI 27.96 kg/m   Physical Exam Vitals and nursing note reviewed.  Constitutional:      General: She is not in acute distress. HENT:     Head: Normocephalic and atraumatic.     Nose: Nose normal.  Eyes:     General: No scleral icterus. Neck:     Comments: Cervical collar in place. Cardiovascular:     Rate and Rhythm: Normal rate and regular rhythm.     Pulses: Normal pulses.     Heart sounds: Normal heart sounds.  Pulmonary:     Effort: Pulmonary effort is normal. No respiratory distress.     Breath sounds: No wheezing.  Abdominal:     Palpations: Abdomen is soft.     Tenderness: There is no abdominal tenderness. There is no guarding or rebound.  Musculoskeletal:     Cervical back: Normal range of motion.     Right lower leg: No edema.     Left lower leg: No edema.     Comments: Mild is palpation of the left anterior superior iliac crest.  No other bony tenderness over joints or long bones of the upper and lower extremities.     Patient says yes to palpation of midline C-spine through c-collar.  Full range of motion of upper and lower extremity joints shown after palpation was conducted; with 5/5 symmetrical strength in upper and lower extremities. No chest wall tenderness, no facial or cranial tenderness.   Patient has intact sensation grossly in lower and upper extremities. Intact patellar and ankle reflexes. Patient able to ambulate without difficulty.  Radial and DP pulses palpated BL.   Skin:    General: Skin is warm and dry.     Capillary Refill: Capillary refill takes less than 2 seconds.     Comments: 9 cm laceration of the left frontal scalp extends to the galea  Patient is able to lift bilateral eyebrows equally  Neurological:     Mental Status: She is alert. Mental status is at baseline.     Comments: Patient is oriented to self which is baseline for her.  She moves all 4 extremities has no cranial nerve deficits on my examination is able to lift  both eyebrows smile is even pupils are equal and symmetric and react appropriately to light bilaterally.  Psychiatric:        Mood and Affect: Mood normal.        Behavior: Behavior normal.     ED Results / Procedures / Treatments   Labs (all labs ordered are listed, but only abnormal results are displayed) Labs Reviewed  CBC WITH DIFFERENTIAL/PLATELET - Abnormal; Notable for the following components:      Result Value   WBC 16.0 (*)    Hemoglobin 11.7 (*)    Neutro Abs 12.7 (*)    All other components within normal limits  COMPREHENSIVE METABOLIC PANEL - Abnormal; Notable for the following components:   Glucose, Bld 118 (*)    Creatinine, Ser 1.13 (*)    Total Protein 5.6 (*)    Albumin 3.2 (*)    GFR, Estimated 47 (*)    All other components within normal limits  CK  PROTIME-INR  TYPE AND SCREEN    EKG EKG Interpretation  Date/Time:  Wednesday April 24 2020 20:21:47 EST Ventricular Rate:  75 PR Interval:    QRS Duration: 122 QT Interval:  398 QTC Calculation: 445 R Axis:   -35 Text Interpretation: Sinus rhythm Right bundle branch block No significant change since last tracing Confirmed by Blanchie Dessert [phone]) on 04/24/2020 9:25:13 PM   Radiology CT Head Wo Contrast  Result Date: 04/24/2020 CLINICAL DATA:  Facial trauma, fall, intubated EXAM: CT HEAD WITHOUT CONTRAST CT CERVICAL SPINE WITHOUT CONTRAST TECHNIQUE: Multidetector CT imaging of the head and cervical spine was performed following the standard protocol without intravenous contrast. Multiplanar CT image reconstructions of the cervical spine were also generated. COMPARISON:  01/10/2016, MR head 01/10/2016 FINDINGS: CT HEAD FINDINGS Brain: Cerebral ventricle sizes are concordant with the degree of cerebral volume loss. Patchy and confluent areas of decreased attenuation are noted throughout the deep and periventricular white matter of the cerebral hemispheres bilaterally, compatible with chronic  microvascular ischemic disease. no evidence of large-territorial acute infarction. No parenchymal hemorrhage. No mass lesion. No extra-axial collection. No mass effect or midline shift. No hydrocephalus. Basilar cisterns are patent. Vascular: No hyperdense vessel. Skull: Skull base lucency well-corticated and previously identified on prior CT head 01/10/2016 does not represent an acute fracture (4:9, 10:49). No acute fracture or focal lesion. Sinuses/Orbits: Paranasal sinuses and mastoid air cells are clear. Bilateral lens replacement. Otherwise the orbits are unremarkable. Other: 0.6 cm left temporal hematoma with several foci of subcutaneus soft tissue emphysema. CT CERVICAL SPINE FINDINGS Alignment: Grade 1 anterolisthesis of C4 on C5. Skull base and vertebrae: Multilevel degenerative changes of the spine. No acute fracture. No aggressive appearing focal osseous lesion or focal pathologic process. Soft tissues and spinal canal: No prevertebral fluid or swelling. No visible canal hematoma. Disc levels:  Maintained. Upper chest: Unremarkable. Other: None. IMPRESSION: 1. No acute intracranial abnormality. 2. A 0.6 cm left temporal hematoma and subcutaneus soft tissue emphysema with no associated underlying skull fracture. 3. No acute displaced fracture or traumatic listhesis of the cervical spine. Electronically Signed   By: Iven Finn M.D.   On: 04/24/2020 21:58   CT Cervical Spine Wo Contrast  Result Date: 04/24/2020 CLINICAL DATA:  Facial trauma, fall, intubated EXAM: CT HEAD WITHOUT CONTRAST CT CERVICAL SPINE WITHOUT CONTRAST TECHNIQUE: Multidetector CT imaging of the head and cervical spine was performed following the standard protocol without intravenous contrast. Multiplanar CT image reconstructions of the cervical spine were also generated. COMPARISON:  01/10/2016, MR head 01/10/2016 FINDINGS: CT HEAD FINDINGS Brain: Cerebral ventricle sizes are concordant with the degree of cerebral volume loss.  Patchy and confluent areas of decreased attenuation are noted throughout the deep and periventricular white matter of the cerebral hemispheres bilaterally, compatible with chronic microvascular ischemic disease. no evidence of large-territorial acute infarction. No parenchymal hemorrhage. No mass lesion. No extra-axial collection. No mass effect  or midline shift. No hydrocephalus. Basilar cisterns are patent. Vascular: No hyperdense vessel. Skull: Skull base lucency well-corticated and previously identified on prior CT head 01/10/2016 does not represent an acute fracture (4:9, 10:49). No acute fracture or focal lesion. Sinuses/Orbits: Paranasal sinuses and mastoid air cells are clear. Bilateral lens replacement. Otherwise the orbits are unremarkable. Other: 0.6 cm left temporal hematoma with several foci of subcutaneus soft tissue emphysema. CT CERVICAL SPINE FINDINGS Alignment: Grade 1 anterolisthesis of C4 on C5. Skull base and vertebrae: Multilevel degenerative changes of the spine. No acute fracture. No aggressive appearing focal osseous lesion or focal pathologic process. Soft tissues and spinal canal: No prevertebral fluid or swelling. No visible canal hematoma. Disc levels:  Maintained. Upper chest: Unremarkable. Other: None. IMPRESSION: 1. No acute intracranial abnormality. 2. A 0.6 cm left temporal hematoma and subcutaneus soft tissue emphysema with no associated underlying skull fracture. 3. No acute displaced fracture or traumatic listhesis of the cervical spine. Electronically Signed   By: Iven Finn M.D.   On: 04/24/2020 21:58   CT ABDOMEN PELVIS W CONTRAST  Result Date: 04/24/2020 CLINICAL DATA:  Abdominal pain, nausea, and vomiting. Coffee ground emesis. EXAM: CT ABDOMEN AND PELVIS WITH CONTRAST TECHNIQUE: Multidetector CT imaging of the abdomen and pelvis was performed using the standard protocol following bolus administration of intravenous contrast. CONTRAST:  52mL OMNIPAQUE IOHEXOL 300  MG/ML  SOLN COMPARISON:  02/15/2013 FINDINGS: Lower chest: Atelectasis in the lung bases. Large esophageal hiatal hernia with nearly all of the stomach in the chest. Mild cardiac enlargement. Hepatobiliary: Mild diffuse fatty infiltration of the liver. No focal lesions. Gallbladder and bile ducts are unremarkable. Pancreas: Unremarkable. No pancreatic ductal dilatation or surrounding inflammatory changes. Spleen: Normal in size without focal abnormality. Adrenals/Urinary Tract: No adrenal gland nodules. Nephrograms are symmetrical. Bilateral renal cysts, unchanged. No hydronephrosis or hydroureter. Bladder is normal. Stomach/Bowel: Stomach, small bowel, and colon are not abnormally distended. Stool fills the colon. No inflammatory changes. Appendix is normal. Vascular/Lymphatic: Aortic atherosclerosis. No enlarged abdominal or pelvic lymph nodes. Reproductive: Status post hysterectomy. No adnexal masses. Other: No abdominal wall hernia or abnormality. No abdominopelvic ascites. Musculoskeletal: Slight anterior subluxation at L5-S1. Postoperative changes in the lower lumbar spine. Degenerative changes in the spine and hips. IMPRESSION: 1. No acute process demonstrated in the abdomen or pelvis. No evidence of bowel obstruction or inflammation. 2. Large esophageal hiatal hernia with nearly all of the stomach in the chest. 3. Mild diffuse fatty infiltration of the liver. 4. Bilateral renal cysts. 5. Aortic atherosclerosis. Aortic Atherosclerosis (ICD10-I70.0). Electronically Signed   By: Lucienne Capers M.D.   On: 04/24/2020 05:15   DG Pelvis Portable  Result Date: 04/24/2020 CLINICAL DATA:  Trauma.  Fall.  Pelvic pain. EXAM: PORTABLE PELVIS 1-2 VIEWS COMPARISON:  02/15/2013 FINDINGS: No evidence of pelvic fracture. Contrast present within the bladder. Ordinary osteoarthritis of the SI joints. Inter spinous device at L4-5. IMPRESSION: No acute or traumatic finding. Ordinary osteoarthritis of the SI joints.  Electronically Signed   By: Nelson Chimes M.D.   On: 04/24/2020 20:15    Procedures .Marland KitchenLaceration Repair  Date/Time: 04/24/2020 11:28 PM Performed by: Tedd Sias, PA Authorized by: Tedd Sias, PA   Consent:    Consent obtained:  Verbal   Consent given by:  Patient   Risks discussed:  Infection, need for additional repair, pain, poor cosmetic result and poor wound healing   Alternatives discussed:  No treatment and delayed treatment Universal protocol:    Procedure  explained and questions answered to patient or proxy's satisfaction: yes     Relevant documents present and verified: yes     Test results available and properly labeled: yes     Imaging studies available: yes     Required blood products, implants, devices, and special equipment available: yes     Site/side marked: yes     Immediately prior to procedure, a time out was called: yes     Patient identity confirmed:  Verbally with patient Anesthesia (see MAR for exact dosages):    Anesthesia method:  Local infiltration   Local anesthetic:  Lidocaine 1% WITH epi Laceration details:    Location:  Scalp   Scalp location:  Frontal (left frontal/temporal scalp laceration)   Length (cm):  9 Repair type:    Repair type:  Complex Pre-procedure details:    Preparation:  Imaging obtained to evaluate for foreign bodies Exploration:    Limited defect created (wound extended): no     Hemostasis achieved with:  Direct pressure, epinephrine and tied off vessels   Wound extent: no foreign bodies/material noted and no tendon damage noted     Contaminated: no   Treatment:    Area cleansed with:  Saline   Amount of cleaning:  Standard   Irrigation solution:  Sterile saline   Irrigation volume:  500   Irrigation method:  Pressure wash   Visualized foreign bodies/material removed: no     Debridement:  Minimal   Undermining:  None   Scar revision: no   Subcutaneous repair:    Suture size:  4-0   Suture material:   Vicryl   Suture technique:  Figure eight   Number of sutures:  1 Skin repair:    Repair method:  Sutures   Suture size:  3-0   Suture material:  Prolene   Suture technique:  Simple interrupted   Number of sutures:  7 Approximation:    Approximation:  Close Post-procedure details:    Dressing:  Antibiotic ointment and non-adherent dressing   Patient tolerance of procedure:  Tolerated well, no immediate complications Comments:     Lidocaine with epinephrine and figure-of-eight suture used to stop bleeding.  Marland Kitchen.Laceration Repair  Date/Time: 04/25/2020 12:20 AM Performed by: Tedd Sias, PA Authorized by: Tedd Sias, PA   Consent:    Consent obtained:  Verbal   Consent given by:  Patient   Risks discussed:  Infection, need for additional repair, pain, poor cosmetic result and poor wound healing   Alternatives discussed:  No treatment and delayed treatment Universal protocol:    Procedure explained and questions answered to patient or proxy's satisfaction: yes     Relevant documents present and verified: yes     Test results available and properly labeled: yes     Imaging studies available: yes     Required blood products, implants, devices, and special equipment available: yes     Site/side marked: yes     Immediately prior to procedure, a time out was called: yes     Patient identity confirmed:  Verbally with patient Anesthesia (see MAR for exact dosages):    Anesthesia method:  Local infiltration   Local anesthetic:  Lidocaine 1% WITH epi Laceration details:    Location:  Scalp   Scalp location:  Frontal   Length (cm):  9 Exploration:    Hemostasis achieved with:  Direct pressure and epinephrine   Wound extent: no foreign bodies/material noted and no tendon damage noted  Contaminated: no   Treatment:    Area cleansed with:  Saline   Amount of cleaning:  Standard   Irrigation solution:  Sterile saline   Irrigation method:  Pressure wash   Visualized  foreign bodies/material removed: no   Skin repair:    Repair method:  Sutures   Suture technique: DEEP DERMAL.   Number of sutures:  7 Approximation:    Approximation:  Close Post-procedure details:    Dressing:  Antibiotic ointment and non-adherent dressing   Patient tolerance of procedure:  Tolerated well, no immediate complications   (including critical care time)  Medications Ordered in ED Medications  ondansetron (ZOFRAN) injection 4 mg (0 mg Intravenous Hold 04/24/20 1958)  lidocaine-EPINEPHrine (XYLOCAINE W/EPI) 1 %-1:100000 (with pres) injection (  Given 04/24/20 1927)  Tdap (BOOSTRIX) injection 0.5 mL (0.5 mLs Intramuscular Given 04/24/20 2045)  bupivacaine (MARCAINE) 0.5 % injection 10 mL (10 mLs Infiltration Given 04/24/20 2301)    ED Course  I have reviewed the triage vital signs and the nursing notes.  Pertinent labs & imaging results that were available during my care of the patient were reviewed by me and considered in my medical decision making (see chart for details).  Patient is a 66-year-old female with past medical history detailed left presented today [facility].  She was seen less than 30 minutes prior to fall therefore length of time around it must of been minimal.  She is oriented to self which is at baseline for her.  She does have symptoms palpation of the left anterior superior iliac spine and also seems to have some neck pain which she endorses with palpation of the C-spine however she also states she has no pain-her exam is somewhat difficult due to her dementia. She does have significant 9 cm laceration of the left frontal scalp that extends to the galea.  There is not any significant muscular involvement she is able to lift bilateral eyebrows symmetrically.  Hemostasis achieved with figure-of-eight Vicryl suture in the area of bleeding.  Lidocaine with epinephrine was also useful in achieving hemostasis.  This was done prior to CT  imaging.  CT head, C-spine and x-ray of hip and pelvis were without any fractures, intracranial hemorrhage or acute abnormalities.  Clinical Course as of Apr 25 20  Wed Apr 24, 2020  2202 IMPRESSION: 1. No acute intracranial abnormality. 2. A 0.6 cm left temporal hematoma and subcutaneus soft tissue emphysema with no associated underlying skull fracture. 3. No acute displaced fracture or traumatic listhesis of the cervical spine.    [WF]    Clinical Course User Index [WF] Tedd Sias, PA   After imaging 7 Prolene stitches were placed in simple abrupted fashion to close the laceration.  Patient tolerated procedure well.  She is ambulatory now.  CK within normal limits.  CMP without any significant electrolyte abnormalities.  Protein albumin mildly low likely due to older age and malnutrition.  CBC with mild leukocytosis of 16 this could be a result of the fall and stress state however it appears that she had a similar leukocytosis yesterday and has over the past few years had similar values.  Perhaps this is medication induced or some other chronic issue does not appear related to her symptoms today she is afebrile not tachycardic and well-appearing overall doubt sepsis or cancer.  I discussed this case with my attending physician who cosigned this note including patient's presenting symptoms, physical exam, and planned diagnostics and  interventions. Attending physician stated agreement with plan or made changes to plan which were implemented.   Attending physician assessed patient at bedside.  Patient discharged with no further complaints at this time.  I discussed with daughter who is at bedside during most of the visit in the ER today.   We will discharge home to SNF/memory center.  She will follow up with her PCP for suture removal in 7 days.  Return to ER sooner if any new or concerning symptoms.  Will provide with short course of Keflex scalp wounds are very low likelihood of  infection however patient is elderly and high risk.  Updated on Tdap.  MDM Rules/Calculators/A&P                          Final Clinical Impression(s) / ED Diagnoses Final diagnoses:  Fall, initial encounter  Laceration of scalp without foreign body, initial encounter  Strain of neck muscle, initial encounter    Rx / DC Orders ED Discharge Orders         Ordered    cephALEXin (KEFLEX) 500 MG capsule  2 times daily        04/25/20 0014           Tedd Sias, [place] 04/25/20 2111    Blanchie Dessert, MD 04/26/20 2216
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
## 2020-04-25 MED ORDER — CEPHALEXIN 500 MG PO CAPS: 500.0000 mg | ORAL_CAPSULE | Freq: Two times a day (BID) | ORAL | 0 refills | Status: AC

## 2020-04-25 NOTE — Discharge Instructions (Addendum)
You will need to have your sutures removed please notify the primary care doctor's office. Have sutures removed in 7 days.   Please wash daily with warm soapy water.  Keep area clean and dry.  In an effort to be very careful in prescribing you just a few days of antibiotics to prevent infection.  If she has any new or worsening or concerning symptoms please return to the ER for reevaluation.  Head CT, cervical spine CT, pelvic x-ray and lab work was reassuringly normal.  Very mild elevation WBC which may be completely normal for her given the recent fall however if she spikes any fevers or has any other new or concerning symptoms this would warrant reevaluation.

## 2020-04-25 NOTE — ED Notes (Signed)
Called [facility] 

## 2020-04-25 NOTE — ED Notes (Signed)
Attempted to call [facility] to give report without answer.

## 2020-06-05 ENCOUNTER — Other Ambulatory Visit: Payer: Self-pay

## 2020-06-05 ENCOUNTER — Emergency Department (HOSPITAL_COMMUNITY): Payer: Medicare Other

## 2020-06-05 ENCOUNTER — Emergency Department (HOSPITAL_COMMUNITY)
Admission: EM | Admit: 2020-06-05 | Discharge: 2020-06-06 | Disposition: A | Discharge status: Home / Self Care | Payer: Medicare Other | Attending: Emergency Medicine | Admitting: Emergency Medicine

## 2020-06-05 ENCOUNTER — Encounter (HOSPITAL_COMMUNITY): Payer: Self-pay

## 2020-06-05 DIAGNOSIS — R531 Weakness: Secondary | ICD-10-CM | POA: Diagnosis present

## 2020-06-05 DIAGNOSIS — R590 Localized enlarged lymph nodes: Secondary | ICD-10-CM | POA: Insufficient documentation

## 2020-06-05 DIAGNOSIS — R222 Localized swelling, mass and lump, trunk: Secondary | ICD-10-CM

## 2020-06-05 LAB — COMPREHENSIVE METABOLIC PANEL
ALT: 14 U/L (ref 0–44)
AST: 15 U/L (ref 15–41)
Albumin: 3.8 g/dL (ref 3.5–5.0)
Alkaline Phosphatase: 82 U/L (ref 38–126)
Anion gap: 8 (ref 5–15)
BUN: 22 mg/dL (ref 8–23)
CO2: 25 mmol/L (ref 22–32)
Calcium: 9.4 mg/dL (ref 8.9–10.3)
Chloride: 108 mmol/L (ref 98–111)
Creatinine, Ser: 1.1 mg/dL — ABNORMAL HIGH (ref 0.44–1.00)
GFR, Estimated: 49 mL/min — ABNORMAL LOW (ref >60)
Glucose, Bld: 94 mg/dL (ref 70–99)
Potassium: 4.2 mmol/L (ref 3.5–5.1)
Sodium: 141 mmol/L (ref 135–145)
Total Bilirubin: 0.5 mg/dL (ref 0.3–1.2)
Total Protein: 6.8 g/dL (ref 6.5–8.1)

## 2020-06-05 LAB — I-STAT CHEM 8, ED
BUN: 26 mg/dL — ABNORMAL HIGH (ref 8–23)
Calcium, Ion: 1.25 mmol/L (ref 1.15–1.40)
Chloride: 107 mmol/L (ref 98–111)
Creatinine, Ser: 1 mg/dL (ref 0.44–1.00)
Glucose, Bld: 90 mg/dL (ref 70–99)
HCT: 33 % — ABNORMAL LOW (ref 36.0–46.0)
Hemoglobin: 11.2 g/dL — ABNORMAL LOW (ref 12.0–15.0)
Potassium: 4.3 mmol/L (ref 3.5–5.1)
Sodium: 141 mmol/L (ref 135–145)
TCO2: 28 mmol/L (ref 22–32)

## 2020-06-05 MED ORDER — IOHEXOL 300 MG/ML  SOLN
75.0000 mL | Freq: Once | INTRAMUSCULAR | Status: AC | PRN
  Administered 2020-06-05: 75 mL via INTRAVENOUS

## 2020-06-05 NOTE — ED Triage Notes (Signed)
Pt arrives EMS from [facility] for c/o generalized weakness and swollen lymph nodes. Hx dementia. Pt has no complaints.

## 2020-06-05 NOTE — ED Provider Notes (Signed)
[place] DEPT Provider Note   CSN: [id number] Arrival date & time: 06/05/20  1858     History Chief Complaint  Patient presents with  . Weakness    Jeanne Compton is a 87 y.o. female history of dementia, lung cancer status post chemotherapy, here presenting with swelling in the supraclavicular area.  Patient is demented and is from a nursing home.  She was noted to have some swollen lymph nodes in the lower neck and supraclavicular area. Patient is unable to give much history.  Patient has no complaints.  Daughter is at bedside and states that last time she saw mother was about a week ago and the swelling was not there.  Patient has been vaccinated against COVID and had booster shot.   The history is provided by the EMS personnel.       History reviewed. No pertinent past medical history.  There are no problems to display for this patient.   History reviewed. No pertinent surgical history.   OB History   No obstetric history on file.     No family history on file.  Social History   Tobacco Use  . Smoking status: Never Smoker  . Smokeless tobacco: Never Used  Substance Use Topics  . Alcohol use: Never  . Drug use: Never    Home Medications Prior to Admission medications   Medication Sig Start Date End Date Taking? Authorizing Provider  atorvastatin (LIPITOR) 20 MG tablet Take 20 mg by mouth daily. 05/14/20  Yes [provider]  clonazePAM (KLONOPIN) 0.5 MG tablet Take 0.5 mg by mouth 2 (two) times daily. 05/31/20  Yes [provider]  famotidine (PEPCID) 20 MG tablet Take 20 mg by mouth at bedtime. 06/03/20  Yes [provider]  lisinopril (ZESTRIL) 2.5 MG tablet Take 2.5 mg by mouth daily. 05/18/20  Yes [provider]  melatonin 5 MG TABS Take 5 mg by mouth at bedtime.   Yes [provider]  metoprolol succinate (TOPROL-XL) 25 MG 24 hr tablet Take 12.5 mg by mouth daily. 05/18/20  Yes  [provider]  neomycin-bacitracin-polymyxin (NEOSPORIN) [phone] ointment Apply 1 application topically daily. To scalp laceration   Yes [provider]  psyllium (FIBER LAXATIVE) 0.52 g capsule Take 0.52 g by mouth at bedtime.   Yes [provider]  risperiDONE (RISPERDAL) 0.25 MG tablet Take 0.25 mg by mouth at bedtime. 05/21/20  Yes [provider]  sertraline (ZOLOFT) 50 MG tablet Take 50 mg by mouth at bedtime. 06/05/20  Yes [provider]    Allergies    Compazine [prochlorperazine]  Review of Systems   Review of Systems  Neurological: Positive for weakness.  All other systems reviewed and are negative.   Physical Exam Updated Vital Signs BP (!) 145/70   Pulse 62   Temp 98.6 F (37 C) (Oral)   Resp 18   SpO2 96%   Physical Exam Vitals and nursing note reviewed.  Constitutional:      Appearance: Normal appearance.  HENT:     Head: Normocephalic.     Nose: Nose normal.     Mouth/Throat:     Mouth: Mucous membranes are moist.  Eyes:     Extraocular Movements: Extraocular movements intact.     Pupils: Pupils are equal, round, and reactive to light.  Neck:     Comments: + enlarged lymph nodes bilaterally  Cardiovascular:     Rate and Rhythm: Normal rate and regular rhythm.  Pulses: Normal pulses.     Heart sounds: Normal heart sounds.  Pulmonary:     Effort: Pulmonary effort is normal.     Breath sounds: Normal breath sounds.     Comments: + supra clavicular lymphadenopathy  Abdominal:     General: Abdomen is flat.     Palpations: Abdomen is soft.  Musculoskeletal:        General: Normal range of motion.     Cervical back: Normal range of motion.  Skin:    General: Skin is warm.     Capillary Refill: Capillary refill takes less than 2 seconds.  Neurological:     Mental Status: She is alert.     Comments: Demented, moving all extremities      ED Results / Procedures / Treatments   Labs (all labs  ordered are listed, but only abnormal results are displayed) Labs Reviewed  I-STAT CHEM 8, ED - Abnormal; Notable for the following components:      Result Value   BUN 26 (*)    Hemoglobin 11.2 (*)    HCT 33.0 (*)    All other components within normal limits  CBC WITH DIFFERENTIAL/PLATELET  COMPREHENSIVE METABOLIC PANEL    EKG None  Radiology CT Chest W Contrast  Result Date: 06/05/2020 CLINICAL DATA:  Dementia, generalized weakness, lymphadenopathy, cough EXAM: CT CHEST WITH CONTRAST TECHNIQUE: Multidetector CT imaging of the chest was performed during intravenous contrast administration. CONTRAST:  70mL OMNIPAQUE IOHEXOL 300 MG/ML  SOLN COMPARISON:  01/12/2016 FINDINGS: Cardiovascular: No significant coronary artery calcification. Global cardiac size within normal limits. No pericardial effusion. Central pulmonary arteries are of normal caliber. Moderate atherosclerotic calcification within the thoracic aorta. No aortic aneurysm. Mediastinum/Nodes: Multiple subcentimeter nodules are noted within the thyroid gland bilaterally, unlikely of clinical significance given their size and the patient's age. No pathologic thoracic adenopathy. Large hiatal hernia again identified with organo-axial gastric volvulus. The entire stomach is essentially within the thoracic compartment. No obstruction. Lungs/Pleura: Mass like scarring within the right apex is unchanged from prior examination, safely considered benign. The lungs are otherwise clear. No pneumothorax or pleural effusion. Central airways are widely patent. Upper Abdomen: Mild to moderate hepatic steatosis. No acute abnormality. Musculoskeletal: No acute bone abnormality. IMPRESSION: Stable large hiatal hernia with superimposed organo-axial gastric volvulus. No evidence of obstruction. No acute intrathoracic pathology identified. Aortic Atherosclerosis (ICD10-I70.0). Electronically Signed   By: Fidela Salisbury MD   On: 06/05/2020 23:02   DG Chest Port  1 View  Result Date: 06/05/2020 CLINICAL DATA:  Weakness wall in lymph nodes EXAM: PORTABLE CHEST 1 VIEW COMPARISON:  02/15/2013, CT 01/12/2016 FINDINGS: Chronic nodular scarring in the right upper lobe. Large hiatal hernia. Minimal atelectasis or scar at the left base. No acute airspace disease, pleural effusion or pneumothorax. Aortic atherosclerosis. IMPRESSION: Chronic scarring in the right upper lobe. Minimal atelectasis or scar at the left base. Large hiatal hernia. Electronically Signed   By: Donavan Foil M.D.   On: 06/05/2020 21:44    Procedures Procedures (including critical care time)  Medications Ordered in ED Medications  iohexol (OMNIPAQUE) 300 MG/ML solution 75 mL (75 mLs Intravenous Contrast Given 06/05/20 2240)    ED Course  I have reviewed the triage vital signs and the nursing notes.  Pertinent labs & imaging results that were available during my care of the patient were reviewed by me and considered in my medical decision making (see chart for details).    MDM Rules/Calculators/A&P  Jeanne Compton is a 87 y.o. female here presenting with enlarged lymph nodes in the supraclavicular area and neck area.  Patient has a history of lung cancer.  I wonder if she has recurrent cancer now has mets up to the supraclavicular area.  Plan to get CBC and CMP and CT neck and CT chest  11:28 PM Labs unremarkable. CT chest showed hiatal hernia that is chronic. CBC and CMP pending. Signed out to Dr. Leonette Monarch in the ED.   Final Clinical Impression(s) / ED Diagnoses Final diagnoses:  None    Rx / DC Orders ED Discharge Orders    None       Drenda Freeze, MD 06/05/20 2329

## 2020-06-06 DIAGNOSIS — R531 Weakness: Secondary | ICD-10-CM | POA: Diagnosis not present

## 2020-06-06 LAB — CBC WITH DIFFERENTIAL/PLATELET
Abs Immature Granulocytes: 0.07 10*3/uL (ref 0.00–0.07)
Basophils Absolute: 0 10*3/uL (ref 0.0–0.1)
Basophils Relative: 0 %
Eosinophils Absolute: 0.2 10*3/uL (ref 0.0–0.5)
Eosinophils Relative: 1 %
HCT: 31.7 % — ABNORMAL LOW (ref 36.0–46.0)
Hemoglobin: 9.6 g/dL — ABNORMAL LOW (ref 12.0–15.0)
Immature Granulocytes: 1 %
Lymphocytes Relative: 23 %
Lymphs Abs: 3.2 10*3/uL (ref 0.7–4.0)
MCH: 25.9 pg — ABNORMAL LOW (ref 26.0–34.0)
MCHC: 30.3 g/dL (ref 30.0–36.0)
MCV: 85.4 fL (ref 80.0–100.0)
Monocytes Absolute: 1 10*3/uL (ref 0.1–1.0)
Monocytes Relative: 7 %
Neutro Abs: 9.6 10*3/uL — ABNORMAL HIGH (ref 1.7–7.7)
Neutrophils Relative %: 68 %
Platelets: 220 10*3/uL (ref 150–400)
RBC: 3.71 MIL/uL — ABNORMAL LOW (ref 3.87–5.11)
RDW: 13 % (ref 11.5–15.5)
WBC: 14 10*3/uL — ABNORMAL HIGH (ref 4.0–10.5)
nRBC: 0 % (ref 0.0–0.2)

## 2020-06-06 NOTE — ED Notes (Signed)
Pt ambulated to bathroom with one person assist

## 2020-06-06 NOTE — ED Notes (Signed)
[facility] has been called and patient paperwork has been printed.

## 2020-06-06 NOTE — ED Notes (Signed)
Attempted to call report to [facility] with no answer

## 2020-06-06 NOTE — ED Notes (Signed)
Updated daughter, [name], by phone

## 2020-06-06 NOTE — ED Notes (Signed)
Pt attempted to get out of bed x2 in 10 minutes. Pt placed on monitor. Pt stated "what happens if I take all this off." Mittens placed on pt for pt safety due to fall risk and pulling off VS equipment.

## 2020-06-06 NOTE — ED Provider Notes (Signed)
I assumed care of this patient.  Please see previous provider note for further details of Hx, PE.  Briefly patient is a 87 y.o. female who presented for supraclavicular swelling. CT chest reassuring. Pending labs and CT neck.  Labs and CT neck reassuring.  The patient appears reasonably screened and/or stabilized for discharge and I doubt any other medical condition or other [facility] requiring further screening, evaluation, or treatment in the ED at this time prior to discharge. Safe for discharge with strict return precautions.  Disposition: Discharge  Condition: Good  I have discussed the results, Dx and Tx plan with the patient/family who expressed understanding and agree(s) with the plan. Discharge instructions discussed at length. The patient/family was given strict return precautions who verbalized understanding of the instructions. No further questions at time of discharge.    ED Discharge Orders    None       Follow Up: Reymundo Poll, MD [place] [place] [address] [phone]  Call  To schedule an appointment for close follow up          Belford Pascucci, Grayce Sessions, MD 06/06/20 [phone]

## 2020-06-06 NOTE — ED Notes (Signed)
[facility] bedside 

## 2020-06-06 NOTE — ED Notes (Signed)
This RN reviewed ED course with daughter over the phone. Daughter verbalized understanding of discharge instructions with teach method used. Daughter asked all questions and verbalized understanding.

## 2020-06-06 NOTE — Discharge Instructions (Signed)
Your work up was reassuring. There was no swollen lymph nodes noted on your neck and chest CTs.

## 2020-07-09 ENCOUNTER — Emergency Department (HOSPITAL_COMMUNITY)
Admission: EM | Admit: 2020-07-09 | Discharge: 2020-07-10 | Disposition: A | Discharge status: Home / Self Care | Payer: Medicare Other | Attending: Emergency Medicine | Admitting: Emergency Medicine

## 2020-07-09 ENCOUNTER — Encounter (HOSPITAL_COMMUNITY): Payer: Self-pay | Admitting: Emergency Medicine

## 2020-07-09 ENCOUNTER — Other Ambulatory Visit: Payer: Self-pay

## 2020-07-09 DIAGNOSIS — F0390 Unspecified dementia without behavioral disturbance: Secondary | ICD-10-CM | POA: Diagnosis not present

## 2020-07-09 DIAGNOSIS — Z85118 Personal history of other malignant neoplasm of bronchus and lung: Secondary | ICD-10-CM | POA: Insufficient documentation

## 2020-07-09 DIAGNOSIS — Z923 Personal history of irradiation: Secondary | ICD-10-CM | POA: Insufficient documentation

## 2020-07-09 DIAGNOSIS — Z79899 Other long term (current) drug therapy: Secondary | ICD-10-CM | POA: Diagnosis not present

## 2020-07-09 DIAGNOSIS — N390 Urinary tract infection, site not specified: Secondary | ICD-10-CM | POA: Insufficient documentation

## 2020-07-09 DIAGNOSIS — R042 Hemoptysis: Secondary | ICD-10-CM | POA: Diagnosis not present

## 2020-07-09 DIAGNOSIS — R110 Nausea: Secondary | ICD-10-CM | POA: Diagnosis present

## 2020-07-09 DIAGNOSIS — I10 Essential (primary) hypertension: Secondary | ICD-10-CM | POA: Insufficient documentation

## 2020-07-09 NOTE — ED Triage Notes (Signed)
Pt from [facility] sent to ED for bright red hemoptysis. Has report nausea over the last week has had decreased activity and  Affect and mood have flattened. Verbally appropriate to her baseline but has had decreased interaction or activity.

## 2020-07-10 ENCOUNTER — Emergency Department (HOSPITAL_COMMUNITY): Payer: Medicare Other

## 2020-07-10 ENCOUNTER — Encounter (HOSPITAL_COMMUNITY): Payer: Self-pay

## 2020-07-10 DIAGNOSIS — N390 Urinary tract infection, site not specified: Secondary | ICD-10-CM | POA: Diagnosis not present

## 2020-07-10 LAB — CBC WITH DIFFERENTIAL/PLATELET
Abs Immature Granulocytes: 0.07 10*3/uL (ref 0.00–0.07)
Basophils Absolute: 0 10*3/uL (ref 0.0–0.1)
Basophils Relative: 0 %
Eosinophils Absolute: 0.1 10*3/uL (ref 0.0–0.5)
Eosinophils Relative: 1 %
HCT: 38.1 % (ref 36.0–46.0)
Hemoglobin: 11.6 g/dL — ABNORMAL LOW (ref 12.0–15.0)
Immature Granulocytes: 0 %
Lymphocytes Relative: 16 %
Lymphs Abs: 2.9 10*3/uL (ref 0.7–4.0)
MCH: 25.2 pg — ABNORMAL LOW (ref 26.0–34.0)
MCHC: 30.4 g/dL (ref 30.0–36.0)
MCV: 82.6 fL (ref 80.0–100.0)
Monocytes Absolute: 1 10*3/uL (ref 0.1–1.0)
Monocytes Relative: 6 %
Neutro Abs: 13.9 10*3/uL — ABNORMAL HIGH (ref 1.7–7.7)
Neutrophils Relative %: 77 %
Platelets: 276 10*3/uL (ref 150–400)
RBC: 4.61 MIL/uL (ref 3.87–5.11)
RDW: 13.5 % (ref 11.5–15.5)
WBC: 18.1 10*3/uL — ABNORMAL HIGH (ref 4.0–10.5)
nRBC: 0 % (ref 0.0–0.2)

## 2020-07-10 LAB — URINALYSIS, ROUTINE W REFLEX MICROSCOPIC
Bilirubin Urine: NEGATIVE
Glucose, UA: NEGATIVE mg/dL
Hgb urine dipstick: NEGATIVE
Ketones, ur: NEGATIVE mg/dL
Nitrite: POSITIVE — AB
Protein, ur: NEGATIVE mg/dL
Specific Gravity, Urine: 1.017 (ref 1.005–1.030)
pH: 5 (ref 5.0–8.0)

## 2020-07-10 LAB — COMPREHENSIVE METABOLIC PANEL
ALT: 13 U/L (ref 0–44)
AST: 13 U/L — ABNORMAL LOW (ref 15–41)
Albumin: 3.9 g/dL (ref 3.5–5.0)
Alkaline Phosphatase: 85 U/L (ref 38–126)
Anion gap: 10 (ref 5–15)
BUN: 28 mg/dL — ABNORMAL HIGH (ref 8–23)
CO2: 25 mmol/L (ref 22–32)
Calcium: 9.6 mg/dL (ref 8.9–10.3)
Chloride: 106 mmol/L (ref 98–111)
Creatinine, Ser: 1.08 mg/dL — ABNORMAL HIGH (ref 0.44–1.00)
GFR, Estimated: 50 mL/min — ABNORMAL LOW (ref >60)
Glucose, Bld: 127 mg/dL — ABNORMAL HIGH (ref 70–99)
Potassium: 4.3 mmol/L (ref 3.5–5.1)
Sodium: 141 mmol/L (ref 135–145)
Total Bilirubin: 0.7 mg/dL (ref 0.3–1.2)
Total Protein: 7.2 g/dL (ref 6.5–8.1)

## 2020-07-10 LAB — PROTIME-INR
INR: 1.1 (ref 0.8–1.2)
Prothrombin Time: 14 seconds (ref 11.4–15.2)

## 2020-07-10 LAB — D-DIMER, QUANTITATIVE: D-Dimer, Quant: 0.86 ug/mL-FEU — ABNORMAL HIGH (ref 0.00–0.50)

## 2020-07-10 MED ORDER — CEFDINIR 300 MG PO CAPS: 300.0000 mg | ORAL_CAPSULE | Freq: Two times a day (BID) | ORAL | 0 refills | Status: DC

## 2020-07-10 MED ORDER — HALOPERIDOL LACTATE 5 MG/ML IJ SOLN
2.0000 mg | Freq: Once | INTRAMUSCULAR | Status: AC
  Administered 2020-07-10: 2 mg via INTRAVENOUS
  Filled 2020-07-10: qty 1

## 2020-07-10 MED ORDER — SODIUM CHLORIDE 0.9 % IV BOLUS
500.0000 mL | Freq: Once | INTRAVENOUS | Status: AC
  Administered 2020-07-10: 500 mL via INTRAVENOUS

## 2020-07-10 MED ORDER — IOHEXOL 350 MG/ML SOLN
100.0000 mL | Freq: Once | INTRAVENOUS | Status: AC | PRN
  Administered 2020-07-10: 80 mL via INTRAVENOUS

## 2020-07-10 MED ORDER — SODIUM CHLORIDE 0.9 % IV SOLN
1.0000 g | Freq: Once | INTRAVENOUS | Status: AC
  Administered 2020-07-10: 1 g via INTRAVENOUS
  Filled 2020-07-10: qty 10

## 2020-07-10 NOTE — ED Notes (Signed)
Stand by assist pt to bathroom.

## 2020-07-10 NOTE — ED Provider Notes (Addendum)
[place] DEPT Provider Note   CSN: [id number] Arrival date & time: 07/09/20  2309     History Chief Complaint  Patient presents with  . Hemoptysis    Jeanne Compton is a 87 y.o. female.  Patient presents to the emergency room for evaluation of either coughing up blood or vomiting blood tonight.  Information provided by EMS, patient is demented and cannot provide any further information.  Patient reportedly has been complaining of nausea and has had decreased activity over the last week or so.  Level 5 caveat due to dementia.        Past Medical History:  Diagnosis Date  . Azotemia   . Dementia ([place])   . Hx of breast reduction, elective   . Lung cancer ([place])    hx of radiation    Patient Active Problem List   Diagnosis Date Noted  . Dementia ([place])   . Slurred speech   . Essential hypertension   . Facial droop 01/10/2016  . Acute encephalopathy 02/15/2013  . Hyponatremia 02/15/2013  . Hypokalemia 02/15/2013  . Lactic acidosis 02/15/2013  . RBBB 02/15/2013    Past Surgical History:  Procedure Laterality Date  . ABDOMINAL HYSTERECTOMY    . BACK SURGERY    . BREAST REDUCTION SURGERY    . CARPAL TUNNEL RELEASE    . Ct Biopsy of the lung       OB History   No obstetric history on file.     History reviewed. No pertinent family history.  Social History   Tobacco Use  . Smoking status: Never Smoker  . Smokeless tobacco: Never Used  Substance Use Topics  . Alcohol use: No  . Drug use: No    Home Medications Prior to Admission medications   Medication Sig Start Date End Date Taking? Authorizing Provider  atorvastatin (LIPITOR) 20 MG tablet Take 20 mg by mouth daily. 05/14/20  Yes [provider]  clonazePAM (KLONOPIN) 0.5 MG tablet Take 0.5 mg by mouth 2 (two) times daily. 05/31/20  Yes [provider]  famotidine (PEPCID) 20 MG tablet Take 20 mg by mouth at bedtime. 06/03/20  Yes [provider]   lisinopril (ZESTRIL) 2.5 MG tablet Take 2.5 mg by mouth daily. 05/18/20  Yes [provider]  melatonin 5 MG TABS Take 5 mg by mouth at bedtime.   Yes [provider]  metoprolol succinate (TOPROL-XL) 25 MG 24 hr tablet Take 12.5 mg by mouth daily. 05/18/20  Yes [provider]  PROCTOSOL HC 2.5 % rectal cream Place 1 application rectally every 8 (eight) hours as needed for hemorrhoids. 07/09/20  Yes [provider]  psyllium (FIBER LAXATIVE) 0.52 g capsule Take 0.52 g by mouth at bedtime.   Yes [provider]  risperiDONE (RISPERDAL) 0.25 MG tablet Take 0.25 mg by mouth at bedtime.   Yes [provider]  sertraline (ZOLOFT) 50 MG tablet Take 50 mg by mouth at bedtime. 06/05/20  Yes [provider]    Allergies    Compazine [prochlorperazine] and Compazine [prochlorperazine]  Review of Systems   Review of Systems  Unable to perform ROS: Dementia    Physical Exam Updated Vital Signs BP (!) 148/57   Pulse 60   Temp 97.8 F (36.6 C) (Oral)   Resp 19   SpO2 95%   Physical Exam Vitals and nursing note reviewed.  Constitutional:      General: She is not in acute distress.    Appearance:  Normal appearance. She is well-developed and well-nourished.  HENT:     Head: Normocephalic and atraumatic.     Right Ear: Hearing normal.     Left Ear: Hearing normal.     Nose: Nose normal.     Mouth/Throat:     Mouth: Oropharynx is clear and moist and mucous membranes are normal.  Eyes:     Extraocular Movements: EOM normal.     Conjunctiva/sclera: Conjunctivae normal.     Pupils: Pupils are equal, round, and reactive to light.  Cardiovascular:     Rate and Rhythm: Regular rhythm.     Heart sounds: S1 normal and S2 normal. No murmur heard. No friction rub. No gallop.   Pulmonary:     Effort: Pulmonary effort is normal. No respiratory distress.     Breath sounds: Normal breath sounds.  Chest:     Chest wall: No tenderness.   Abdominal:     General: Bowel sounds are normal.     Palpations: Abdomen is soft. There is no hepatosplenomegaly.     Tenderness: There is no abdominal tenderness. There is no guarding or rebound. Negative signs include Murphy's sign and McBurney's sign.     Hernia: No hernia is present.  Musculoskeletal:        General: Normal range of motion.     Cervical back: Normal range of motion and neck supple.  Skin:    General: Skin is warm, dry and intact.     Findings: No rash.     Nails: There is no cyanosis.  Neurological:     Mental Status: She is alert. Mental status is at baseline. She is confused.     GCS: GCS eye subscore is 4. GCS verbal subscore is 4. GCS motor subscore is 6.     Cranial Nerves: No cranial nerve deficit.     Sensory: No sensory deficit.     Coordination: Coordination normal.     Deep Tendon Reflexes: Strength normal.  Psychiatric:        Mood and Affect: Mood and affect normal.        Speech: Speech normal.        Behavior: Behavior normal.        Thought Content: Thought content normal.     ED Results / Procedures / Treatments   Labs (all labs ordered are listed, but only abnormal results are displayed) Labs Reviewed  CBC WITH DIFFERENTIAL/PLATELET - Abnormal; Notable for the following components:      Result Value   WBC 18.1 (*)    Hemoglobin 11.6 (*)    MCH 25.2 (*)    Neutro Abs 13.9 (*)    All other components within normal limits  COMPREHENSIVE METABOLIC PANEL - Abnormal; Notable for the following components:   Glucose, Bld 127 (*)    BUN 28 (*)    Creatinine, Ser 1.08 (*)    AST 13 (*)    GFR, Estimated 50 (*)    All other components within normal limits  URINALYSIS, ROUTINE W REFLEX MICROSCOPIC - Abnormal; Notable for the following components:   Color, Urine AMBER (*)    Nitrite POSITIVE (*)    Leukocytes,Ua LARGE (*)    Bacteria, UA RARE (*)    All other components within normal limits  D-DIMER, QUANTITATIVE (NOT AT [facility]) - Abnormal;  Notable for the following components:   D-Dimer, Quant 0.86 (*)    All other components within normal limits  PROTIME-INR  POC OCCULT BLOOD, ED  EKG EKG Interpretation  Date/Time:  Wednesday July 10 2020 01:12:19 EST Ventricular Rate:  63 PR Interval:    QRS Duration: 125 QT Interval:  418 QTC Calculation: 428 R Axis:   36 Text Interpretation: Sinus rhythm Left atrial enlargement Right bundle branch block No significant change since last tracing Confirmed by Orpah Greek [phone]) on 07/10/2020 1:30:15 AM   Radiology CT ANGIO CHEST PE W OR WO CONTRAST  Result Date: 07/10/2020 CLINICAL DATA:  Hemoptysis with nausea and positive D-dimer. Clinical concern for pulmonary embolus. EXAM: CT ANGIOGRAPHY CHEST WITH CONTRAST TECHNIQUE: Multidetector CT imaging of the chest was performed using the standard protocol during bolus administration of intravenous contrast. Multiplanar CT image reconstructions and MIPs were obtained to evaluate the vascular anatomy. CONTRAST:  67mL OMNIPAQUE IOHEXOL 350 MG/ML SOLN COMPARISON:  CT chest 06/05/2020 and 01/12/2016. FINDINGS: Cardiovascular: Heart size upper normal. No substantial pericardial effusion. Atherosclerotic calcification is noted in the wall of the thoracic aorta. No filling defect within the opacified pulmonary arteries to suggest the presence of an acute pulmonary embolus Mediastinum/Nodes: No mediastinal lymphadenopathy. There is no hilar lymphadenopathy. The esophagus has normal imaging features. Large hiatal hernia evident There is no axillary lymphadenopathy. Lungs/Pleura: 2.1 x 1.6 cm nodular opacity in the right lung apex is stable back to the 2017 exam suggesting benign etiology such as scarring. Chronic atelectasis noted right lower lobe adjacent to the large hiatal hernia. No new focal airspace consolidation. No pneumothorax or pleural effusion. Upper Abdomen: Unremarkable. Musculoskeletal: No worrisome lytic or sclerotic osseous  abnormality. Review of the MIP images confirms the above findings. IMPRESSION: 1. No CT evidence for acute pulmonary embolus. 2. Large hiatal hernia with chronic atelectasis right lower lobe. 3. Stable 2.1 cm nodular opacity in the right lung apex back to 2017 suggesting benign etiology such as scarring. 4. Aortic Atherosclerosis (ICD10-I70.0). Electronically Signed   By: Misty Stanley M.D.   On: 07/10/2020 06:12   DG Chest Port 1 View  Result Date: 07/10/2020 CLINICAL DATA:  Encephalopathy EXAM: PORTABLE CHEST 1 VIEW COMPARISON:  06/05/2020 FINDINGS: Cardiomegaly with large hiatal hernia. Bibasilar atelectasis. No pulmonary edema or pleural effusion. IMPRESSION: Cardiomegaly and large hiatal hernia. Bibasilar atelectasis. Electronically Signed   By: Ulyses Jarred M.D.   On: 07/10/2020 01:07    Procedures Procedures   Medications Ordered in ED Medications  sodium chloride 0.9 % bolus 500 mL (0 mLs Intravenous Stopped 07/10/20 0420)  cefTRIAXone (ROCEPHIN) 1 g in sodium chloride 0.9 % 100 mL IVPB (0 g Intravenous Stopped 07/10/20 0550)  iohexol (OMNIPAQUE) 350 MG/ML injection 100 mL (80 mLs Intravenous Contrast Given 07/10/20 0532)  haloperidol lactate (HALDOL) injection 2 mg (2 mg Intravenous Given 07/10/20 0521)    ED Course  I have reviewed the triage vital signs and the nursing notes.  Pertinent labs & imaging results that were available during my care of the patient were reviewed by me and considered in my medical decision making (see chart for details).    MDM Rules/Calculators/A&P                          Patient presents to the emergency department from skilled nursing facility. Information from the facility was unclear. Staff was unsure if the patient had hemoptysis or hematemesis. She had some production that was blood-streaked but no frank hemorrhaging. At arrival to the ER she is awake and alert without any distress. She has baseline dementia and is at her normal  baseline without  complaints. She has not had any hemoptysis or hematemesis here in the emergency department. Work-up has been reassuring. Hemoglobin is at baseline. Vital signs are normal. PE study is negative.  Patient reportedly has had some slight altering of her mental status over the last week. She does have evidence of urinary tract infection which is likely the cause. Treated with Rocephin here and will continue Omnicef. Culture pending. No evidence of sepsis, will not require hospitalization.  Addendum: Discussed patient with daughter.  Daughter confirms that she has had episodes of vomiting and has had blood in her vomit.  This is happened on previous occasions and her work-up was felt to be consistent with Mallory-Weiss tear and hiatal hernia.  This fits the current presentation, no further work-up necessary.  Final Clinical Impression(s) / ED Diagnoses Final diagnoses:  Urinary tract infection without hematuria, site unspecified    Rx / DC Orders ED Discharge Orders    None       Chassidy Layson, Gwenyth Allegra, MD 07/10/20 [id number]    Orpah Greek, MD 07/10/20 [phone]

## 2020-07-10 NOTE — ED Notes (Signed)
[facility] contacted for transport.  Attempted to call [facility] for report and got voicemail, message left for them to call me back.

## 2020-07-10 NOTE — ED Notes (Signed)
Report called to [facility] has picked patient up to transport.

## 2020-09-04 ENCOUNTER — Encounter (HOSPITAL_COMMUNITY): Payer: Self-pay | Admitting: Emergency Medicine

## 2020-09-04 ENCOUNTER — Inpatient Hospital Stay (HOSPITAL_COMMUNITY): Payer: Medicare Other

## 2020-09-04 ENCOUNTER — Emergency Department (HOSPITAL_COMMUNITY): Payer: Medicare Other

## 2020-09-04 ENCOUNTER — Inpatient Hospital Stay (HOSPITAL_COMMUNITY)
Admission: EM | Admit: 2020-09-04 | Discharge: 2020-09-08 | DRG: 389 | Disposition: A | Discharge status: Hospice | Payer: Medicare Other | Attending: Internal Medicine | Admitting: Internal Medicine

## 2020-09-04 DIAGNOSIS — N281 Cyst of kidney, acquired: Secondary | ICD-10-CM | POA: Diagnosis present

## 2020-09-04 DIAGNOSIS — Z90710 Acquired absence of both cervix and uterus: Secondary | ICD-10-CM

## 2020-09-04 DIAGNOSIS — R634 Abnormal weight loss: Secondary | ICD-10-CM | POA: Diagnosis present

## 2020-09-04 DIAGNOSIS — Z66 Do not resuscitate: Secondary | ICD-10-CM | POA: Diagnosis present

## 2020-09-04 DIAGNOSIS — K562 Volvulus: Principal | ICD-10-CM | POA: Diagnosis present

## 2020-09-04 DIAGNOSIS — I700 Atherosclerosis of aorta: Secondary | ICD-10-CM | POA: Diagnosis present

## 2020-09-04 DIAGNOSIS — F0280 Dementia in other diseases classified elsewhere without behavioral disturbance: Secondary | ICD-10-CM | POA: Diagnosis present

## 2020-09-04 DIAGNOSIS — Y738 Miscellaneous gastroenterology and urology devices associated with adverse incidents, not elsewhere classified: Secondary | ICD-10-CM | POA: Diagnosis not present

## 2020-09-04 DIAGNOSIS — Z20822 Contact with and (suspected) exposure to covid-19: Secondary | ICD-10-CM | POA: Diagnosis present

## 2020-09-04 DIAGNOSIS — Z85118 Personal history of other malignant neoplasm of bronchus and lung: Secondary | ICD-10-CM

## 2020-09-04 DIAGNOSIS — Z79899 Other long term (current) drug therapy: Secondary | ICD-10-CM | POA: Diagnosis not present

## 2020-09-04 DIAGNOSIS — Z800 Family history of malignant neoplasm of digestive organs: Secondary | ICD-10-CM

## 2020-09-04 DIAGNOSIS — K3189 Other diseases of stomach and duodenum: Secondary | ICD-10-CM | POA: Diagnosis present

## 2020-09-04 DIAGNOSIS — Z515 Encounter for palliative care: Secondary | ICD-10-CM | POA: Diagnosis not present

## 2020-09-04 DIAGNOSIS — K449 Diaphragmatic hernia without obstruction or gangrene: Secondary | ICD-10-CM

## 2020-09-04 DIAGNOSIS — D72829 Elevated white blood cell count, unspecified: Secondary | ICD-10-CM | POA: Diagnosis present

## 2020-09-04 DIAGNOSIS — Z781 Physical restraint status: Secondary | ICD-10-CM | POA: Diagnosis not present

## 2020-09-04 DIAGNOSIS — Z888 Allergy status to other drugs, medicaments and biological substances status: Secondary | ICD-10-CM | POA: Diagnosis not present

## 2020-09-04 DIAGNOSIS — K920 Hematemesis: Secondary | ICD-10-CM

## 2020-09-04 DIAGNOSIS — K219 Gastro-esophageal reflux disease without esophagitis: Secondary | ICD-10-CM | POA: Diagnosis present

## 2020-09-04 DIAGNOSIS — J9811 Atelectasis: Secondary | ICD-10-CM | POA: Diagnosis present

## 2020-09-04 DIAGNOSIS — I160 Hypertensive urgency: Secondary | ICD-10-CM

## 2020-09-04 DIAGNOSIS — G309 Alzheimer's disease, unspecified: Secondary | ICD-10-CM | POA: Diagnosis present

## 2020-09-04 DIAGNOSIS — I10 Essential (primary) hypertension: Secondary | ICD-10-CM | POA: Diagnosis present

## 2020-09-04 DIAGNOSIS — D649 Anemia, unspecified: Secondary | ICD-10-CM | POA: Diagnosis present

## 2020-09-04 DIAGNOSIS — Z0189 Encounter for other specified special examinations: Secondary | ICD-10-CM

## 2020-09-04 DIAGNOSIS — Z923 Personal history of irradiation: Secondary | ICD-10-CM

## 2020-09-04 DIAGNOSIS — K56609 Unspecified intestinal obstruction, unspecified as to partial versus complete obstruction: Secondary | ICD-10-CM

## 2020-09-04 DIAGNOSIS — T83021A Displacement of indwelling urethral catheter, initial encounter: Secondary | ICD-10-CM | POA: Diagnosis not present

## 2020-09-04 LAB — COMPREHENSIVE METABOLIC PANEL
ALT: 15 U/L (ref 0–44)
AST: 16 U/L (ref 15–41)
Albumin: 3.8 g/dL (ref 3.5–5.0)
Alkaline Phosphatase: 78 U/L (ref 38–126)
Anion gap: 8 (ref 5–15)
BUN: 22 mg/dL (ref 8–23)
CO2: 27 mmol/L (ref 22–32)
Calcium: 9.9 mg/dL (ref 8.9–10.3)
Chloride: 106 mmol/L (ref 98–111)
Creatinine, Ser: 1.02 mg/dL — ABNORMAL HIGH (ref 0.44–1.00)
GFR, Estimated: 54 mL/min — ABNORMAL LOW (ref >60)
Glucose, Bld: 161 mg/dL — ABNORMAL HIGH (ref 70–99)
Potassium: 3.8 mmol/L (ref 3.5–5.1)
Sodium: 141 mmol/L (ref 135–145)
Total Bilirubin: 0.9 mg/dL (ref 0.3–1.2)
Total Protein: 6.4 g/dL — ABNORMAL LOW (ref 6.5–8.1)

## 2020-09-04 LAB — URINALYSIS, ROUTINE W REFLEX MICROSCOPIC
Bacteria, UA: NONE SEEN
Bilirubin Urine: NEGATIVE
Glucose, UA: NEGATIVE mg/dL
Hgb urine dipstick: NEGATIVE
Ketones, ur: NEGATIVE mg/dL
Leukocytes,Ua: NEGATIVE
Nitrite: NEGATIVE
Protein, ur: 100 mg/dL — AB
Specific Gravity, Urine: 1.026 (ref 1.005–1.030)
pH: 9 — ABNORMAL HIGH (ref 5.0–8.0)

## 2020-09-04 LAB — CBC WITH DIFFERENTIAL/PLATELET
Abs Immature Granulocytes: 0.11 10*3/uL — ABNORMAL HIGH (ref 0.00–0.07)
Basophils Absolute: 0 10*3/uL (ref 0.0–0.1)
Basophils Relative: 0 %
Eosinophils Absolute: 0 10*3/uL (ref 0.0–0.5)
Eosinophils Relative: 0 %
HCT: 38.1 % (ref 36.0–46.0)
Hemoglobin: 11.5 g/dL — ABNORMAL LOW (ref 12.0–15.0)
Immature Granulocytes: 1 %
Lymphocytes Relative: 9 %
Lymphs Abs: 1.9 10*3/uL (ref 0.7–4.0)
MCH: 23.4 pg — ABNORMAL LOW (ref 26.0–34.0)
MCHC: 30.2 g/dL (ref 30.0–36.0)
MCV: 77.6 fL — ABNORMAL LOW (ref 80.0–100.0)
Monocytes Absolute: 0.7 10*3/uL (ref 0.1–1.0)
Monocytes Relative: 3 %
Neutro Abs: 18.6 10*3/uL — ABNORMAL HIGH (ref 1.7–7.7)
Neutrophils Relative %: 87 %
Platelets: 270 10*3/uL (ref 150–400)
RBC: 4.91 MIL/uL (ref 3.87–5.11)
RDW: 16.2 % — ABNORMAL HIGH (ref 11.5–15.5)
WBC: 21.4 10*3/uL — ABNORMAL HIGH (ref 4.0–10.5)
nRBC: 0 % (ref 0.0–0.2)

## 2020-09-04 LAB — LIPASE, BLOOD: Lipase: 27 U/L (ref 11–51)

## 2020-09-04 LAB — LACTIC ACID, PLASMA
Lactic Acid, Venous: 2.7 mmol/L (ref 0.5–1.9)
Lactic Acid, Venous: 2.3 mmol/L (ref 0.5–1.9)

## 2020-09-04 LAB — POC OCCULT BLOOD, ED: Fecal Occult Bld: POSITIVE — AB

## 2020-09-04 LAB — SARS CORONAVIRUS 2 (TAT 6-24 HRS): SARS Coronavirus 2: NEGATIVE

## 2020-09-04 MED ORDER — LISINOPRIL 5 MG PO TABS
2.5000 mg | ORAL_TABLET | Freq: Every day | ORAL | Status: DC
  Filled 2020-09-04 (×2): qty 1

## 2020-09-04 MED ORDER — PSYLLIUM 95 % PO PACK
1.0000 | PACK | Freq: Every day | ORAL | Status: DC
  Filled 2020-09-04: qty 1

## 2020-09-04 MED ORDER — ONDANSETRON HCL 4 MG/2ML IJ SOLN
4.0000 mg | Freq: Once | INTRAMUSCULAR | Status: AC
  Administered 2020-09-04: 4 mg via INTRAVENOUS
  Filled 2020-09-04: qty 2

## 2020-09-04 MED ORDER — MELATONIN 5 MG PO TABS
5.0000 mg | ORAL_TABLET | Freq: Every day | ORAL | Status: DC
  Filled 2020-09-04: qty 1

## 2020-09-04 MED ORDER — PANTOPRAZOLE SODIUM 40 MG IV SOLR
40.0000 mg | Freq: Once | INTRAVENOUS | Status: AC
  Administered 2020-09-04: 40 mg via INTRAVENOUS
  Filled 2020-09-04: qty 40

## 2020-09-04 MED ORDER — HYDROMORPHONE HCL 1 MG/ML IJ SOLN
0.5000 mg | INTRAMUSCULAR | Status: DC | PRN
  Administered 2020-09-04: 1 mg via INTRAVENOUS
  Filled 2020-09-04: qty 1

## 2020-09-04 MED ORDER — PANTOPRAZOLE SODIUM 40 MG IV SOLR
40.0000 mg | Freq: Two times a day (BID) | INTRAVENOUS | Status: DC
  Administered 2020-09-04 – 2020-09-05 (×2): 40 mg via INTRAVENOUS
  Filled 2020-09-04 (×2): qty 40

## 2020-09-04 MED ORDER — SERTRALINE HCL 50 MG PO TABS
50.0000 mg | ORAL_TABLET | Freq: Every day | ORAL | Status: DC
  Filled 2020-09-04: qty 1

## 2020-09-04 MED ORDER — CLONAZEPAM 0.5 MG PO TABS
0.5000 mg | ORAL_TABLET | Freq: Two times a day (BID) | ORAL | Status: DC
  Filled 2020-09-04: qty 1

## 2020-09-04 MED ORDER — HYDRALAZINE HCL 20 MG/ML IJ SOLN
5.0000 mg | Freq: Four times a day (QID) | INTRAMUSCULAR | Status: DC | PRN
  Administered 2020-09-04: 5 mg via INTRAVENOUS
  Filled 2020-09-04: qty 1

## 2020-09-04 MED ORDER — HYDROCORTISONE (PERIANAL) 2.5 % EX CREA
1.0000 "application " | TOPICAL_CREAM | Freq: Three times a day (TID) | CUTANEOUS | Status: DC | PRN
  Filled 2020-09-04: qty 28.35

## 2020-09-04 MED ORDER — DIATRIZOATE MEGLUMINE & SODIUM 66-10 % PO SOLN
90.0000 mL | Freq: Once | ORAL | Status: DC
  Filled 2020-09-04: qty 90

## 2020-09-04 MED ORDER — ENOXAPARIN SODIUM 30 MG/0.3ML ~~LOC~~ SOLN: 30.0000 mg | SUBCUTANEOUS | Status: DC

## 2020-09-04 MED ORDER — IOHEXOL 300 MG/ML  SOLN
100.0000 mL | Freq: Once | INTRAMUSCULAR | Status: AC | PRN
  Administered 2020-09-04: 100 mL via INTRAVENOUS

## 2020-09-04 MED ORDER — METOPROLOL SUCCINATE ER 25 MG PO TB24
12.5000 mg | ORAL_TABLET | Freq: Every day | ORAL | Status: DC
  Filled 2020-09-04: qty 1

## 2020-09-04 MED ORDER — ONDANSETRON HCL 4 MG PO TABS: 4.0000 mg | ORAL_TABLET | Freq: Four times a day (QID) | ORAL | Status: DC | PRN

## 2020-09-04 MED ORDER — SODIUM CHLORIDE 0.9 % IV SOLN: INTRAVENOUS | Status: DC

## 2020-09-04 MED ORDER — RISPERIDONE 0.25 MG PO TABS
0.2500 mg | ORAL_TABLET | Freq: Every day | ORAL | Status: DC
  Filled 2020-09-04 (×2): qty 1

## 2020-09-04 MED ORDER — ATORVASTATIN CALCIUM 10 MG PO TABS
20.0000 mg | ORAL_TABLET | Freq: Every day | ORAL | Status: DC
  Filled 2020-09-04: qty 2

## 2020-09-04 MED ORDER — ONDANSETRON HCL 4 MG/2ML IJ SOLN: 4.0000 mg | Freq: Four times a day (QID) | INTRAMUSCULAR | Status: DC | PRN

## 2020-09-04 MED ORDER — OLANZAPINE 10 MG IM SOLR
2.5000 mg | Freq: Three times a day (TID) | INTRAMUSCULAR | Status: DC | PRN
  Administered 2020-09-04: 2.5 mg via INTRAMUSCULAR
  Filled 2020-09-04 (×3): qty 10

## 2020-09-04 NOTE — ED Provider Notes (Signed)
[facility] EMERGENCY DEPARTMENT Provider Note   CSN: [id number] Arrival date & time: 09/04/20  1302     History No chief complaint on file.   Jeanne Compton is a 87 y.o. female.  The history is provided by a relative and medical records. No language interpreter was used.     87 year old female significant history of dementia, hypertension, lung cancer, brought here via EMS for evaluation of concern for GI bleed.  History obtained through EMS.  Family member visit patient at nursing home today and noted that patient had several episodes of coffee-ground emesis and patient was sent here for further evaluation.  According to daughter, she has had her baseline dementia.  I was able to obtain additional history from daughter who is now at bedside.  Per daughter, she visited patient today and noted that patient was complaining of upper abdominal and chest discomfort.  Subsequently patient vomited up dark emesis 3 separate times.  She was very concerned prompting this ER visit.  No report of any NSAIDs use, no report of patient being on anticoagulant.  Patient does have remote history of lung cancer.  No report of any recent cold symptoms.  Patient lives at a nursing facility, in the memory care unit.  She has history of hiatal hernia  Past Medical History:  Diagnosis Date  . Azotemia   . Dementia ([place])   . Hx of breast reduction, elective   . Lung cancer ([place])    hx of radiation    Patient Active Problem List   Diagnosis Date Noted  . Dementia ([place])   . Slurred speech   . Essential hypertension   . Facial droop 01/10/2016  . Acute encephalopathy 02/15/2013  . Hyponatremia 02/15/2013  . Hypokalemia 02/15/2013  . Lactic acidosis 02/15/2013  . RBBB 02/15/2013    Past Surgical History:  Procedure Laterality Date  . ABDOMINAL HYSTERECTOMY    . BACK SURGERY    . BREAST REDUCTION SURGERY    . CARPAL TUNNEL RELEASE    . Ct Biopsy of the lung       OB History    No obstetric history on file.     No family history on file.  Social History   Tobacco Use  . Smoking status: Never Smoker  . Smokeless tobacco: Never Used  Substance Use Topics  . Alcohol use: No  . Drug use: No    Home Medications Prior to Admission medications   Medication Sig Start Date End Date Taking? Authorizing Provider  atorvastatin (LIPITOR) 20 MG tablet Take 20 mg by mouth daily. 05/14/20   [provider]  cefdinir (OMNICEF) 300 MG capsule Take 1 capsule (300 mg total) by mouth 2 (two) times daily. 07/10/20   Orpah Greek, MD  clonazePAM (KLONOPIN) 0.5 MG tablet Take 0.5 mg by mouth 2 (two) times daily. 05/31/20   [provider]  famotidine (PEPCID) 20 MG tablet Take 20 mg by mouth at bedtime. 06/03/20   [provider]  lisinopril (ZESTRIL) 2.5 MG tablet Take 2.5 mg by mouth daily. 05/18/20   [provider]  melatonin 5 MG TABS Take 5 mg by mouth at bedtime.    [provider]  metoprolol succinate (TOPROL-XL) 25 MG 24 hr tablet Take 12.5 mg by mouth daily. 05/18/20   [provider]  PROCTOSOL HC 2.5 % rectal cream Place 1 application rectally every 8 (eight) hours as needed for hemorrhoids. 07/09/20   [provider]  psyllium (  FIBER LAXATIVE) 0.52 g capsule Take 0.52 g by mouth at bedtime.    [provider]  risperiDONE (RISPERDAL) 0.25 MG tablet Take 0.25 mg by mouth at bedtime.    [provider]  sertraline (ZOLOFT) 50 MG tablet Take 50 mg by mouth at bedtime. 06/05/20   [provider]    Allergies    Compazine [prochlorperazine] and Compazine [prochlorperazine]  Review of Systems   Review of Systems  Unable to perform ROS: Dementia    Physical Exam Updated Vital Signs BP (!) 213/88   Pulse 67   Temp 97.7 F (36.5 C) (Temporal)   Resp 16   SpO2 93%   Physical Exam Vitals and nursing note reviewed.  Constitutional:      General: She is not in acute  distress.    Appearance: She is well-developed.  HENT:     Head: Atraumatic.  Eyes:     Conjunctiva/sclera: Conjunctivae normal.  Cardiovascular:     Rate and Rhythm: Normal rate and regular rhythm.     Pulses: Normal pulses.     Heart sounds: Normal heart sounds.  Pulmonary:     Effort: Pulmonary effort is normal.     Breath sounds: Normal breath sounds.  Abdominal:     Palpations: Abdomen is soft.     Tenderness: There is abdominal tenderness (Tenderness to epigastric region on palpation no guarding or rebound tenderness).  Genitourinary:    Comments: Chaperone present during exam.  Normal rectal tone no obvious mass normal color stool on glove, positive fecal occult blood test Musculoskeletal:     Cervical back: Neck supple.     Comments: Able to move all 4 extremities.  Skin:    Findings: No rash.  Neurological:     Mental Status: She is alert. She is disoriented.     Cranial Nerves: Cranial nerves are intact.     Sensory: Sensation is intact.     Motor: Motor function is intact.     Comments: Alert oriented x1.     ED Results / Procedures / Treatments   Labs (all labs ordered are listed, but only abnormal results are displayed) Labs Reviewed  CBC WITH DIFFERENTIAL/PLATELET - Abnormal; Notable for the following components:      Result Value   WBC 21.4 (*)    Hemoglobin 11.5 (*)    MCV 77.6 (*)    MCH 23.4 (*)    RDW 16.2 (*)    Neutro Abs 18.6 (*)    Abs Immature Granulocytes 0.11 (*)    All other components within normal limits  COMPREHENSIVE METABOLIC PANEL - Abnormal; Notable for the following components:   Glucose, Bld 161 (*)    Creatinine, Ser 1.02 (*)    Total Protein 6.4 (*)    GFR, Estimated 54 (*)    All other components within normal limits  POC OCCULT BLOOD, ED - Abnormal; Notable for the following components:   Fecal Occult Bld POSITIVE (*)    All other components within normal limits  SARS CORONAVIRUS 2 (TAT 6-24 HRS)  LIPASE, BLOOD   URINALYSIS, ROUTINE W REFLEX MICROSCOPIC    EKG None  Radiology DG Chest Portable 1 View  Result Date: 09/04/2020 CLINICAL DATA:  Emesis with concern for gastrointestinal bleeding EXAM: PORTABLE CHEST 1 VIEW COMPARISON:  Chest radiograph and chest CT July 10, 2020; chest CT January 12, 2016 FINDINGS: There is a persistent nodular opacity in the right upper lobe, stable compared to prior study from 2017. There  is atelectatic change in the lung bases. Heart is slightly enlarged, stable. Pulmonary vascularity is normal. No adenopathy. There is aortic atherosclerosis. There is a large paraesophageal type hernia primarily to the right of midline, noted previously. No evident bone lesions. IMPRESSION: 1.  Large paraesophageal type hernia again noted. 2. Nodular opacity right upper lobe measuring 2.0 x 0.9 cm, stable since 2017 and felt to have benign etiology given stability over this time interval. 3.  Bibasilar atelectasis. 4.  Stable cardiac prominence. 5.  Aortic Atherosclerosis (ICD10-I70.0). Electronically Signed   By: Lowella Grip III M.D.   On: 09/04/2020 13:56    Procedures Procedures   Medications Ordered in ED Medications  pantoprazole (PROTONIX) injection 40 mg (40 mg Intravenous Given 09/04/20 1500)    ED Course  I have reviewed the triage vital signs and the nursing notes.  Pertinent labs & imaging results that were available during my care of the patient were reviewed by me and considered in my medical decision making (see chart for details).    MDM Rules/Calculators/A&P                          BP (!) 213/88   Pulse 67   Temp 97.7 F (36.5 C) (Temporal)   Resp 16   SpO2 93%   Final Clinical Impression(s) / ED Diagnoses Final diagnoses:  Hypertensive urgency    Rx / DC Orders ED Discharge Orders    None     2:24 PM Patient with significant history of dementia here with concerns of upper GI bleed after she vomits multiple episodes of dark emesis per  daughter who is now at bedside.  She does have some epigastric tenderness on exam, normal stool color however fecal occult blood test is positive.  She is not on any NSAIDs nor on anticoagulant.  No history of alcohol use.  Does have remote history of lung cancer.  Labs remarkable for an elevated count of 21.4.  Chest x-ray shows large paraesophageal type hernia similar to prior.  Nodular opacity in the right upper lung stable since 2017.  Hemoglobin is 11.5.  2:45 PM Normal renal function.  Will obtain abdominal pelvis CT scan for further evaluation.  Appreciate consultation to on-call GI specialist from [facility] who agrees to evaluate patient.  Will consult medicine for admission.  Care discussed with Dr. Rogene Houston.   3:47 PM Pt sign out to oncoming provider who will consult medicine for admission.  GI will offer their input once they evaluate pt.    Domenic Moras, PA-C 09/04/20 1547    Fredia Sorrow, MD 09/05/20 [id number]

## 2020-09-04 NOTE — ED Provider Notes (Addendum)
Accepted handoff at shift change from [facility]. Please see prior provider note for more detail.   Briefly: Patient is 87 y.o.  Here for GI bleed likely upper  Plan: admission   Physical Exam  BP (!) 213/88   Pulse 67   Temp 97.7 F (36.5 C) (Temporal)   Resp 16   SpO2 93%   Physical Exam  ED Course/Procedures     Procedures Results for orders placed or performed during the hospital encounter of 09/04/20  CBC with Differential  Result Value Ref Range   WBC 21.4 (H) 4.0 - 10.5 K/uL   RBC 4.91 3.87 - 5.11 MIL/uL   Hemoglobin 11.5 (L) 12.0 - 15.0 g/dL   HCT 38.1 36.0 - 46.0 %   MCV 77.6 (L) 80.0 - 100.0 fL   MCH 23.4 (L) 26.0 - 34.0 pg   MCHC 30.2 30.0 - 36.0 g/dL   RDW 16.2 (H) 11.5 - 15.5 %   Platelets 270 150 - 400 K/uL   nRBC 0.0 0.0 - 0.2 %   Neutrophils Relative % 87 %   Neutro Abs 18.6 (H) 1.7 - 7.7 K/uL   Lymphocytes Relative 9 %   Lymphs Abs 1.9 0.7 - 4.0 K/uL   Monocytes Relative 3 %   Monocytes Absolute 0.7 0.1 - 1.0 K/uL   Eosinophils Relative 0 %   Eosinophils Absolute 0.0 0.0 - 0.5 K/uL   Basophils Relative 0 %   Basophils Absolute 0.0 0.0 - 0.1 K/uL   Immature Granulocytes 1 %   Abs Immature Granulocytes 0.11 (H) 0.00 - 0.07 K/uL  Comprehensive metabolic panel  Result Value Ref Range   Sodium 141 135 - 145 mmol/L   Potassium 3.8 3.5 - 5.1 mmol/L   Chloride 106 98 - 111 mmol/L   CO2 27 22 - 32 mmol/L   Glucose, Bld 161 (H) 70 - 99 mg/dL   BUN 22 8 - 23 mg/dL   Creatinine, Ser 1.02 (H) 0.44 - 1.00 mg/dL   Calcium 9.9 8.9 - 10.3 mg/dL   Total Protein 6.4 (L) 6.5 - 8.1 g/dL   Albumin 3.8 3.5 - 5.0 g/dL   AST 16 15 - 41 U/L   ALT 15 0 - 44 U/L   Alkaline Phosphatase 78 38 - 126 U/L   Total Bilirubin 0.9 0.3 - 1.2 mg/dL   GFR, Estimated 54 (L) >60 mL/min   Anion gap 8 5 - 15  Lipase, blood  Result Value Ref Range   Lipase 27 11 - 51 U/L  POC occult blood, ED Provider will collect  Result Value Ref Range   Fecal Occult Bld POSITIVE (A)  NEGATIVE   DG Chest Portable 1 View  Result Date: 09/04/2020 CLINICAL DATA:  Emesis with concern for gastrointestinal bleeding EXAM: PORTABLE CHEST 1 VIEW COMPARISON:  Chest radiograph and chest CT July 10, 2020; chest CT January 12, 2016 FINDINGS: There is a persistent nodular opacity in the right upper lobe, stable compared to prior study from 2017. There is atelectatic change in the lung bases. Heart is slightly enlarged, stable. Pulmonary vascularity is normal. No adenopathy. There is aortic atherosclerosis. There is a large paraesophageal type hernia primarily to the right of midline, noted previously. No evident bone lesions. IMPRESSION: 1.  Large paraesophageal type hernia again noted. 2. Nodular opacity right upper lobe measuring 2.0 x 0.9 cm, stable since 2017 and felt to have benign etiology given stability over this time interval. 3.  Bibasilar atelectasis. 4.  Stable  cardiac prominence. 5.  Aortic Atherosclerosis (ICD10-I70.0). Electronically Signed   By: Lowella Grip III M.D.   On: 09/04/2020 13:56    MDM   Discussed with patient the need for hospitalization and further care. Patient is understanding and willing to be admitted to hospital.   Spoke with Dr. Roosevelt Locks of hospitalst service who agrees to assume care of patient and bring her into the hospital for further evaluation and management.     Pati Gallo [place], [place] 09/04/20 1607   CT scan result shows gastric volvulus  Discussed with OR nurse who discussed with Dr. Dema Severin. Dr Gershon Crane will be given MRN/name.  Dr. Roosevelt Locks will admit. Family updated.  Will place NG tube.    Pati Gallo [place], [place] 09/04/20 1657    Margette Fast, MD 09/04/20 2358

## 2020-09-04 NOTE — Consult Note (Addendum)
Referring Provider: Dr. Fredia Sorrow  Primary Care Physician:  Reymundo Poll, MD Primary Gastroenterologist:  Althia Forts   Reason for Consultation: Hematemesis  HPI: Jeanne Compton is a 87 y.o. female the past medical history of dementia, pretension, lung cancer s/p radiation no chemo or surgery 10+ years ago. She vomited coffee ground emesis x 2 today at [facility]. Her daughter witnessed a third episode of vomiting and reported emesis was black. She was sent to [facility] ED for further evaluation and management of UGI bleed.   Laboratory studies in the ED: Sodium 141.  Potassium 3.8.  Glucose 161.  BUN 22.  Creatinine 1.02.  Calcium 9.9.  Anion gap 8.  Alk phos 78.  Albumin 3.8. Lipase 27.  AST 16.  ALT 15.  Total bili 0.9.  WBC 21.4.  Hemoglobin 11.5 (base line Hg 11.6 on 07/10/2020).  Hematocrit 38.1.  MCV 77.6.  Platelet 270.  SARS coronavirus 2 test in process.  FOBT positive.    A chest x-ray:showed a large paraesophageal hernia and is stable right upper lobe nodule.    Abdominal/pelvic CT was completed, results pending.    She vomited a moderate amount of coffee-ground assist x1 as reported by the ED RN.  The patient denies having any abdominal pain.  She has dementia and she is confused.  She is attempting to climb out of the stretcher at this time.  Her daughter Jeanne Compton who is her POA is at the bedside.  She  is not on ASA or any anticoagulants.  He has a history of GERD on Famotidine 20 mg daily.  Diet has been decreased over the past 2 to 3 months with associated 10 pound weight loss.  No overt signs of abdominal pain noticed by her daughter.  Patient typically passes a normal brown bowel movement most days.  No reports of melena or bright red rectal bleeding.  She possibly had one colonoscopy in her lifetime, results are unknown.  Father had colon cancer.  Mother had a stomach mass and possibly had colon cancer as well.  Patient has not had an EGD in the past.   However, Jeanne Compton stated her mother was seen in the ED 04/20/2020 and 07/21/2020 with similar hematemesis episodes. The patient was seen in the ED 04/24/2020 secondary to hematemesis described as coffee-ground emesis.  She received Zofran, PPI IV and IV fluids without further vomiting.  An abdominal/pelvic CT identified a large esophageal hiatal hernia with nearly all of the stomach in the chest.  Her hemoglobin level was stable and she was discharged back to [facility].  Presented back to the ED on 07/10/2020 with recurrent hematemesis.  Hemoglobin level was stable at 11.6.  She was hemodynamically stable.  She was treated for possible UTI and she was discharged back to the SNF.   Chest Xray 09/04/2020: 1.  Large paraesophageal type hernia again noted. 2. Nodular opacity right upper lobe measuring 2.0 x 0.9 cm, stable since 2017 and felt to have benign etiology given stability over this time interval. 3.  Bibasilar atelectasis. 4.  Stable cardiac prominence. 5.  Aortic Atherosclerosis   Past Medical History:  Diagnosis Date  . Azotemia   . Dementia ([place])   . Hx of breast reduction, elective   . Lung cancer ([place])    hx of radiation    Past Surgical History:  Procedure Laterality Date  . ABDOMINAL HYSTERECTOMY    . BACK SURGERY    . BREAST REDUCTION SURGERY    .  CARPAL TUNNEL RELEASE    . Ct Biopsy of the lung      Prior to Admission medications   Medication Sig Start Date End Date Taking? Authorizing Provider  atorvastatin (LIPITOR) 20 MG tablet Take 20 mg by mouth daily. 05/14/20   [provider]  cefdinir (OMNICEF) 300 MG capsule Take 1 capsule (300 mg total) by mouth 2 (two) times daily. 07/10/20   Orpah Greek, MD  clonazePAM (KLONOPIN) 0.5 MG tablet Take 0.5 mg by mouth 2 (two) times daily. 05/31/20   [provider]  famotidine (PEPCID) 20 MG tablet Take 20 mg by mouth at bedtime. 06/03/20   [provider]  lisinopril (ZESTRIL) 2.5 MG  tablet Take 2.5 mg by mouth daily. 05/18/20   [provider]  melatonin 5 MG TABS Take 5 mg by mouth at bedtime.    [provider]  metoprolol succinate (TOPROL-XL) 25 MG 24 hr tablet Take 12.5 mg by mouth daily. 05/18/20   [provider]  PROCTOSOL HC 2.5 % rectal cream Place 1 application rectally every 8 (eight) hours as needed for hemorrhoids. 07/09/20   [provider]  psyllium (FIBER LAXATIVE) 0.52 g capsule Take 0.52 g by mouth at bedtime.    [provider]  risperiDONE (RISPERDAL) 0.25 MG tablet Take 0.25 mg by mouth at bedtime.    [provider]  sertraline (ZOLOFT) 50 MG tablet Take 50 mg by mouth at bedtime. 06/05/20   [provider]    No current facility-administered medications for this encounter.   Current Outpatient Medications  Medication Sig Dispense Refill  . atorvastatin (LIPITOR) 20 MG tablet Take 20 mg by mouth daily.    . cefdinir (OMNICEF) 300 MG capsule Take 1 capsule (300 mg total) by mouth 2 (two) times daily. 20 capsule 0  . clonazePAM (KLONOPIN) 0.5 MG tablet Take 0.5 mg by mouth 2 (two) times daily.    . famotidine (PEPCID) 20 MG tablet Take 20 mg by mouth at bedtime.    Marland Kitchen lisinopril (ZESTRIL) 2.5 MG tablet Take 2.5 mg by mouth daily.    . melatonin 5 MG TABS Take 5 mg by mouth at bedtime.    . metoprolol succinate (TOPROL-XL) 25 MG 24 hr tablet Take 12.5 mg by mouth daily.    Marland Kitchen PROCTOSOL HC 2.5 % rectal cream Place 1 application rectally every 8 (eight) hours as needed for hemorrhoids.    . psyllium (FIBER LAXATIVE) 0.52 g capsule Take 0.52 g by mouth at bedtime.    . risperiDONE (RISPERDAL) 0.25 MG tablet Take 0.25 mg by mouth at bedtime.    . sertraline (ZOLOFT) 50 MG tablet Take 50 mg by mouth at bedtime.      Allergies as of 09/04/2020 - Review Complete 09/04/2020  Allergen Reaction Noted  . Compazine [prochlorperazine]  02/15/2013  . Compazine [prochlorperazine] Other (See Comments)  06/05/2020    No family history on file.  Social History   Socioeconomic History  . Marital status: Widowed    Spouse name: Not on file  . Number of children: Not on file  . Years of education: Not on file  . Highest education level: Not on file  Occupational History  . Not on file  Tobacco Use  . Smoking status: Never Smoker  . Smokeless tobacco: Never Used  Substance and Sexual Activity  . Alcohol use: No  . Drug use: No  . Sexual activity: Never  Other Topics Concern  . Not on file  Social History Narrative   ** Merged History Encounter **       Social Determinants of Health   Financial Resource Strain: Not on file  Food Insecurity: Not on file  Transportation Needs: Not on file  Physical Activity: Not on file  Stress: Not on file  Social Connections: Not on file  Intimate Partner Violence: Not on file    Review of Systems: See HPI, limited review of systems as patient has dementia.  Physical Exam: Vital signs in last 24 hours: Temp:  [97.7 F (36.5 C)] 97.7 F (36.5 C) (04/06 1314) Pulse Rate:  [67-73] 67 (04/06 1345) Resp:  [16-29] 16 (04/06 1345) BP: (201-213)/(73-88) 213/88 (04/06 1345) SpO2:  [93 %-94 %] 93 % (04/06 1345)   General: Confused 87 year old female restless, attempting to wiggle out of the ED stretcher in no acute distress. Head:  Normocephalic and atraumatic. Eyes:  No scleral icterus. Conjunctiva pink. Ears:  Normal auditory acuity. Nose:  No deformity, discharge or lesions. Mouth:  Dentition intact. No ulcers or lesions.  Neck:  Supple. No lymphadenopathy or thyromegaly.  Lungs: Breath sounds clear throughout. Heart: Regular rate and rhythm, no murmurs. Abdomen: Soft, generalized tenderness throughout without rebound or guarding.  Abdomen is nondistended.  Positive bowel sounds to all 4 quadrants. Rectal: Deferred. Musculoskeletal:  Symmetrical without gross deformities.  Pulses:  Normal pulses noted. Extremities:  Without  clubbing or edema. Neurologic:  Alert and  oriented x4. No focal deficits.  Skin:  Intact without significant lesions or rashes. Psych:  Alert and cooperative. Normal mood and affect.  Intake/Output from previous day: No intake/output data recorded. Intake/Output this shift: No intake/output data recorded.  Lab Results: Recent Labs    09/04/20 1317  WBC 21.4*  HGB 11.5*  HCT 38.1  PLT 270   BMET Recent Labs    09/04/20 1317  NA 141  K 3.8  CL 106  CO2 27  GLUCOSE 161*  BUN 22  CREATININE 1.02*  CALCIUM 9.9   LFT Recent Labs    09/04/20 1317  PROT 6.4*  ALBUMIN 3.8  AST 16  ALT 15  ALKPHOS 78  BILITOT 0.9   PT/INR No results for input(s): LABPROT, INR in the last 72 hours. Hepatitis Panel No results for input(s): HEPBSAG, HCVAB, HEPAIGM, HEPBIGM in the last 72 hours.    Studies/Results: DG Chest Portable 1 View  Result Date: 09/04/2020 CLINICAL DATA:  Emesis with concern for gastrointestinal bleeding EXAM: PORTABLE CHEST 1 VIEW COMPARISON:  Chest radiograph and chest CT July 10, 2020; chest CT January 12, 2016 FINDINGS: There is a persistent nodular opacity in the right upper lobe, stable compared to prior study from 2017. There is atelectatic change in the lung bases. Heart is slightly enlarged, stable. Pulmonary vascularity is normal. No adenopathy. There is aortic atherosclerosis. There is a large paraesophageal type hernia primarily to the right of midline, noted previously. No evident bone lesions. IMPRESSION: 1.  Large paraesophageal type hernia again noted. 2. Nodular opacity right upper lobe measuring 2.0 x 0.9 cm, stable since 2017 and felt to have benign etiology given stability over this time interval. 3.  Bibasilar atelectasis. 4.  Stable cardiac prominence. 5.  Aortic Atherosclerosis (ICD10-I70.0). Electronically Signed   By: Lowella Grip III M.D.   On: 09/04/2020 13:56    IMPRESSION/PLAN:  2.  87 year old female with dementia and history of  GERD, hiatal hernia and recurrent hematemesis presents to the ED with upper GI bleed/hematemesis and stable anemia.  Hemoglobin  11.5 (baseline Hg 11.6 on 07/10/2020). Chest xray showed a large paraesophageal hernia seen on prior image studies. CTAP results pending. She is hemodynamically stable.  -NPO -IV fluids per the hospital  -Pantoprazole 1m IV bid  -Ondansetron 460mIV Q 6 hours  -Possible diagnostic EGD discussed with SuAlbertine Patriciaue to recurrent hematemesis. EGD benefits and risks discussed including risk with sedation, risk of bleeding, perforation and infection. Jeanne Compton/POA is undecided about pursing an EGD at this time. She will discuss further with Dr. BeTarri Glenn 2.  Generalized abdominal tenderness. Leukocytosis.  WBC 21.4. She is afebrile.  -UA/urine culture if not already done -Await CTAP results   3. History of lung cancer s/p radiation   ADDENDUM: CTAP results received:  1. Severe distension of the stomach with a large hiatal hernia. The configuration is suggestive for a gastric volvulus. 2. Trace right pleural fluid. 3. Multiple renal cysts. Some of the renal cysts are too small to definitively characterize. 4. Question focal wall thickening along the posterior left bladder. This area is poorly characterized and may be better evaluated with dedicated hematuria protocol in the future.  Await further recommendations per Dr. BeAdline Peals Dorathy Daft4/10/2020, 3:19 PM

## 2020-09-04 NOTE — Progress Notes (Addendum)
Jeanne Compton is a 87 y.o. female patient admitted from ED to [phone]. Awake, alert - but confused  X 4 - no acute distress noted.  VSS - Blood pressure (!) 172/74, pulse 78, temperature 98.4 F (36.9 C), temperature source Oral, resp. rate 20, SpO2 91 %.    IV in place, occlusive dsg intact without redness.  NGT in place in the right nare, ED RN reported that theres 1034ml output in the ED. Bilateral soft wrist restraints are in place. Bed on lowest position. Call bell with in reach. Bed alarm is on.   Will cont to eval and treat per MD orders.  Vidal Schwalbe, RN 09/04/2020 11:02 PM

## 2020-09-04 NOTE — ED Triage Notes (Signed)
Pt here from a nursing home with several episodes of coffee ground emesis, baseline dementia according to daughter

## 2020-09-04 NOTE — Consult Note (Signed)
Reason for Consult:Large hiatal hernia  Referring Physician: Dr. Jamey Ripa Patane is an 87 y.o. female.  HPI: This is an 87 year old female with long-standing dementia, hx of lung cancer s/p radiation who presents with multiple episodes of coffee-ground emesis.  She was diagnosed with a large hiatal hernia last year and has had a couple of episodes of coffee-ground emesis.  No anticoagulation.  CT scan showed a large paraesophageal hiatal hernia with some wall thickening in the distal stomach.    GI (Dr. Tarri Glenn) has been consulted and is recommending EGD.  Her daughter is POA and is at the bedside.  The patient is DNR.  Her son is an infectious disease specialist in [place].    Past Medical History:  Diagnosis Date  . Azotemia   . Dementia ([place])   . Hx of breast reduction, elective   . Lung cancer ([place])    hx of radiation    Past Surgical History:  Procedure Laterality Date  . ABDOMINAL HYSTERECTOMY    . BACK SURGERY    . BREAST REDUCTION SURGERY    . CARPAL TUNNEL RELEASE    . Ct Biopsy of the lung      No family history on file.  Social History:  reports that she has never smoked. She has never used smokeless tobacco. She reports that she does not drink alcohol and does not use drugs.  Allergies:  Allergies  Allergen Reactions  . Compazine [Prochlorperazine]     Per MAR  . Compazine [Prochlorperazine] Other (See Comments)    Unknown     Medications:  Prior to Admission medications   Medication Sig Start Date End Date Taking? Authorizing Provider  atorvastatin (LIPITOR) 20 MG tablet Take 20 mg by mouth daily. 05/14/20   [provider]  cefdinir (OMNICEF) 300 MG capsule Take 1 capsule (300 mg total) by mouth 2 (two) times daily. 07/10/20   Orpah Greek, MD  clonazePAM (KLONOPIN) 0.5 MG tablet Take 0.5 mg by mouth 2 (two) times daily. 05/31/20   [provider]  famotidine (PEPCID) 20 MG tablet Take 20 mg by mouth at bedtime. 06/03/20    [provider]  lisinopril (ZESTRIL) 2.5 MG tablet Take 2.5 mg by mouth daily. 05/18/20   [provider]  melatonin 5 MG TABS Take 5 mg by mouth at bedtime.    [provider]  metoprolol succinate (TOPROL-XL) 25 MG 24 hr tablet Take 12.5 mg by mouth daily. 05/18/20   [provider]  PROCTOSOL HC 2.5 % rectal cream Place 1 application rectally every 8 (eight) hours as needed for hemorrhoids. 07/09/20   [provider]  psyllium (FIBER LAXATIVE) 0.52 g capsule Take 0.52 g by mouth at bedtime.    [provider]  risperiDONE (RISPERDAL) 0.25 MG tablet Take 0.25 mg by mouth at bedtime.    [provider]  sertraline (ZOLOFT) 50 MG tablet Take 50 mg by mouth at bedtime. 06/05/20   [provider]     Results for orders placed or performed during the hospital encounter of 09/04/20 (from the past 48 hour(s))  CBC with Differential     Status: Abnormal   Collection Time: 09/04/20  1:17 PM  Result Value Ref Range   WBC 21.4 (H) 4.0 - 10.5 K/uL   RBC 4.91 3.87 - 5.11 MIL/uL   Hemoglobin 11.5 (L) 12.0 - 15.0 g/dL   HCT 38.1 36.0 - 46.0 %   MCV 77.6 (L) 80.0 -  100.0 fL   MCH 23.4 (L) 26.0 - 34.0 pg   MCHC 30.2 30.0 - 36.0 g/dL   RDW 16.2 (H) 11.5 - 15.5 %   Platelets 270 150 - 400 K/uL   nRBC 0.0 0.0 - 0.2 %   Neutrophils Relative % 87 %   Neutro Abs 18.6 (H) 1.7 - 7.7 K/uL   Lymphocytes Relative 9 %   Lymphs Abs 1.9 0.7 - 4.0 K/uL   Monocytes Relative 3 %   Monocytes Absolute 0.7 0.1 - 1.0 K/uL   Eosinophils Relative 0 %   Eosinophils Absolute 0.0 0.0 - 0.5 K/uL   Basophils Relative 0 %   Basophils Absolute 0.0 0.0 - 0.1 K/uL   Immature Granulocytes 1 %   Abs Immature Granulocytes 0.11 (H) 0.00 - 0.07 K/uL    Comment: Performed at [facility] [address] [place], [place] [address]  Comprehensive metabolic panel     Status: Abnormal   Collection Time: 09/04/20  1:17 PM  Result Value Ref Range   Sodium 141  135 - 145 mmol/L   Potassium 3.8 3.5 - 5.1 mmol/L   Chloride 106 98 - 111 mmol/L   CO2 27 22 - 32 mmol/L   Glucose, Bld 161 (H) 70 - 99 mg/dL    Comment: Glucose reference range applies only to samples taken after fasting for at least 8 hours.   BUN 22 8 - 23 mg/dL   Creatinine, Ser 1.02 (H) 0.44 - 1.00 mg/dL   Calcium 9.9 8.9 - 10.3 mg/dL   Total Protein 6.4 (L) 6.5 - 8.1 g/dL   Albumin 3.8 3.5 - 5.0 g/dL   AST 16 15 - 41 U/L   ALT 15 0 - 44 U/L   Alkaline Phosphatase 78 38 - 126 U/L   Total Bilirubin 0.9 0.3 - 1.2 mg/dL   GFR, Estimated 54 (L) >60 mL/min    Comment: (NOTE) Calculated using the CKD-EPI Creatinine Equation (2021)    Anion gap 8 5 - 15    Comment: Performed at [facility] [address] [place], [place] [address]  Lipase, blood     Status: None   Collection Time: 09/04/20  1:17 PM  Result Value Ref Range   Lipase 27 11 - 51 U/L    Comment: Performed at [facility] [address], [place], [place] [address]  POC occult blood, ED Provider will collect     Status: Abnormal   Collection Time: 09/04/20  1:25 PM  Result Value Ref Range   Fecal Occult Bld POSITIVE (A) NEGATIVE  Urinalysis, Routine w reflex microscopic Urine, Catheterized     Status: Abnormal   Collection Time: 09/04/20  4:45 PM  Result Value Ref Range   Color, Urine YELLOW YELLOW   APPearance CLEAR CLEAR   Specific Gravity, Urine 1.026 1.005 - 1.030   pH 9.0 (H) 5.0 - 8.0   Glucose, UA NEGATIVE NEGATIVE mg/dL   Hgb urine dipstick NEGATIVE NEGATIVE   Bilirubin Urine NEGATIVE NEGATIVE   Ketones, ur NEGATIVE NEGATIVE mg/dL   Protein, ur 100 (A) NEGATIVE mg/dL   Nitrite NEGATIVE NEGATIVE   Leukocytes,Ua NEGATIVE NEGATIVE   RBC / HPF 0-5 0 - 5 RBC/hpf   WBC, UA 0-5 0 - 5 WBC/hpf   Bacteria, UA NONE SEEN NONE SEEN   Mucus PRESENT     Comment: Performed at [facility], [address] [place],  [address]    CT ABDOMEN  PELVIS W CONTRAST  Addendum Date: 09/04/2020    ADDENDUM REPORT: 09/04/2020 16:34 ADDENDUM: These results were called by telephone at the time of interpretation on 09/04/2020 at 4:33 pm to provider Pati Gallo, who verbally acknowledged these results. Electronically Signed   By: Markus Daft M.D.   On: 09/04/2020 16:34   Result Date: 09/04/2020 CLINICAL DATA:  Epigastric pain and vomiting blood. EXAM: CT ABDOMEN AND PELVIS WITH CONTRAST TECHNIQUE: Multidetector CT imaging of the abdomen and pelvis was performed using the standard protocol following bolus administration of intravenous contrast. CONTRAST:  157mL OMNIPAQUE IOHEXOL 300 MG/ML  SOLN COMPARISON:  04/24/2020 and chest CT 07/10/2020 FINDINGS: Lower chest: Trace right pleural fluid. Volume loss in the right lower lobe. Atelectasis and volume loss in left lower lobe. Hepatobiliary: Normal appearance of the liver, gallbladder and portal venous system. No biliary dilatation. Pancreas: Unremarkable. No pancreatic ductal dilatation or surrounding inflammatory changes. Spleen: Normal in size without focal abnormality. Adrenals/Urinary Tract: Normal appearance of the adrenal glands. Hypodensities in both kidneys likely represent bilateral renal cysts but some are too small to definitively characterize. Again noted is a peripheral calcification involving the right renal cortex. Fluid in the urinary bladder. Difficult to exclude focal wall thickening in the left posterior bladder on sequence 3, image 73. No hydronephrosis. Stomach/Bowel: Again noted is a large hiatal hernia. Hiatal hernia contains a large amount of fluid and the degree of gastric distension has markedly increased since February 2002. There is a portion of the stomach extending into the left upper abdomen that was not present on the previous examination. Entire stomach is not imaged and there is concern for wall thickening involving the distal stomach. Unusual configuration of the stomach on sequence 7, image 86. The morphology and distension of  the stomach is suggestive for a gastric volvulus. Normal appearance of the duodenum. Normal appearance of the colon and appendix. Vascular/Lymphatic: Atherosclerotic disease without abdominal aortic aneurysm. Reproductive: Status post hysterectomy. No adnexal masses. Other: Negative for ascites.  Negative for free air. Musculoskeletal: Surgical fixation involving the spinous processes L4 and L5. Minimal anterolisthesis at L4-L5. IMPRESSION: 1. Severe distension of the stomach with a large hiatal hernia. The configuration is suggestive for a gastric volvulus. 2. Trace right pleural fluid. 3. Multiple renal cysts. Some of the renal cysts are too small to definitively characterize. 4. Question focal wall thickening along the posterior left bladder. This area is poorly characterized and may be better evaluated with dedicated hematuria protocol in the future. Electronically Signed: By: Markus Daft M.D. On: 09/04/2020 16:28   DG Chest Portable 1 View  Result Date: 09/04/2020 CLINICAL DATA:  Emesis with concern for gastrointestinal bleeding EXAM: PORTABLE CHEST 1 VIEW COMPARISON:  Chest radiograph and chest CT July 10, 2020; chest CT January 12, 2016 FINDINGS: There is a persistent nodular opacity in the right upper lobe, stable compared to prior study from 2017. There is atelectatic change in the lung bases. Heart is slightly enlarged, stable. Pulmonary vascularity is normal. No adenopathy. There is aortic atherosclerosis. There is a large paraesophageal type hernia primarily to the right of midline, noted previously. No evident bone lesions. IMPRESSION: 1.  Large paraesophageal type hernia again noted. 2. Nodular opacity right upper lobe measuring 2.0 x 0.9 cm, stable since 2017 and felt to have benign etiology given stability over this time interval. 3.  Bibasilar atelectasis. 4.  Stable cardiac prominence. 5.  Aortic Atherosclerosis (ICD10-I70.0). Electronically Signed   By: Lowella Grip III M.D.  On:  09/04/2020 13:56    Review of Systems  Unable to perform ROS: Dementia   Blood pressure (!) 158/77, pulse 74, temperature 97.7 F (36.5 C), temperature source Temporal, resp. rate (!) 27, SpO2 97 %. Physical Exam Constitutional:  WDWN in NAD, conversant, no obvious deformities; lying in bed comfortably Eyes:  Pupils equal, round; sclera anicteric; moist conjunctiva; no lid lag HENT:  Oral mucosa moist;  Neck:  No masses palpated, trachea midline; no thyromegaly Lungs:  CTA bilaterally; normal respiratory effort CV:  Regular rate and rhythm; no murmurs; extremities well-perfused with no edema Abd:  +bowel sounds, soft, non-tender, no palpable organomegaly; no palpable hernias Musc:  Unable to assess gait; no apparent clubbing or cyanosis in extremities Lymphatic:  No palpable cervical or axillary lymphadenopathy Skin:  Warm, dry; no sign of jaundice Psychiatric - alert ; calm mood and affect  Assessment/Plan: 1.  Large paraesophageal hiatal hernia - appears to have proximal and distal stomach above the diaphragm, but the mid-portion of the stomach below the diaphragm.  2.  Severe dementia  Recs: No indications for emergent surgery Patient would be a poor surgical candidate overall. NG tube placement to attempt to decompress the stomach and possibly partially reduce the hiatal hernia. GI to decide on EGD  May consider PEG placement to try to pex the stomach down in the abdomen.  Imogene Burn Akelia Husted 09/04/2020, 5:58 PM

## 2020-09-04 NOTE — H&P (Signed)
History and Physical    Jeanne Compton VHQ:[id number] DOB: May 24, 1932 DOA: 09/04/2020  PCP: Reymundo Poll, MD (Confirm with patient/family/NH records and if not entered, this has to be entered at [facility] point of entry) Patient coming from: Memory care unit  I have personally briefly reviewed patient's old medical records in [place]  Chief Complaint: Belly hurts  HPI: Jeanne Compton is a 87 y.o. female with medical history significant of Alzheimer's dementia, HTN, lung cancer status post radiation, GERD, presented with acute onset of abdominal pain and nauseous and vomiting.  Patient baseline dementia, history provided by daughter at bedside.  Daughter reported patient has had decreased appetite for last 1 to 49-month, lost 10+ pounds.  This morning, patient's family visited her and nursing home and patient complained about new onset of stomach aching and then vomited 3 times of dark colored stomach content. ED Course: CT abd showed acute stomach volvulus.  Blood pressure significantly elevated.  Review of Systems: Unable to perform, patient baseline demented  Past Medical History:  Diagnosis Date  . Azotemia   . Dementia ([place])   . Hx of breast reduction, elective   . Lung cancer ([place])    hx of radiation    Past Surgical History:  Procedure Laterality Date  . ABDOMINAL HYSTERECTOMY    . BACK SURGERY    . BREAST REDUCTION SURGERY    . CARPAL TUNNEL RELEASE    . Ct Biopsy of the lung       reports that she has never smoked. She has never used smokeless tobacco. She reports that she does not drink alcohol and does not use drugs.  Allergies  Allergen Reactions  . Compazine [Prochlorperazine]     Per MAR  . Compazine [Prochlorperazine] Other (See Comments)    Unknown     No family history on file.   Prior to Admission medications   Medication Sig Start Date End Date Taking? Authorizing Provider  atorvastatin (LIPITOR) 20 MG tablet Take 20 mg by mouth daily. 05/14/20    [provider]  cefdinir (OMNICEF) 300 MG capsule Take 1 capsule (300 mg total) by mouth 2 (two) times daily. 07/10/20   Orpah Greek, MD  clonazePAM (KLONOPIN) 0.5 MG tablet Take 0.5 mg by mouth 2 (two) times daily. 05/31/20   [provider]  famotidine (PEPCID) 20 MG tablet Take 20 mg by mouth at bedtime. 06/03/20   [provider]  lisinopril (ZESTRIL) 2.5 MG tablet Take 2.5 mg by mouth daily. 05/18/20   [provider]  melatonin 5 MG TABS Take 5 mg by mouth at bedtime.    [provider]  metoprolol succinate (TOPROL-XL) 25 MG 24 hr tablet Take 12.5 mg by mouth daily. 05/18/20   [provider]  PROCTOSOL HC 2.5 % rectal cream Place 1 application rectally every 8 (eight) hours as needed for hemorrhoids. 07/09/20   [provider]  psyllium (FIBER LAXATIVE) 0.52 g capsule Take 0.52 g by mouth at bedtime.    [provider]  risperiDONE (RISPERDAL) 0.25 MG tablet Take 0.25 mg by mouth at bedtime.    [provider]  sertraline (ZOLOFT) 50 MG tablet Take 50 mg by mouth at bedtime. 06/05/20   [provider]    Physical Exam: Vitals:   09/04/20 1545 09/04/20 1600 09/04/20 1630 09/04/20 1715  BP:   (!) 195/75 (!) 158/77  Pulse:    74  Resp: 12 20 (!) 23 (!) 27  Temp:  TempSrc:      SpO2:    97%    Constitutional: NAD, calm, comfortable Vitals:   09/04/20 1545 09/04/20 1600 09/04/20 1630 09/04/20 1715  BP:   (!) 195/75 (!) 158/77  Pulse:    74  Resp: 12 20 (!) 23 (!) 27  Temp:      TempSrc:      SpO2:    97%   Eyes: PERRL, lids and conjunctivae normal ENMT: Mucous membranes are moist. Posterior pharynx clear of any exudate or lesions.Normal dentition.  Neck: normal, supple, no masses, no thyromegaly Respiratory: clear to auscultation bilaterally, no wheezing, no crackles. Normal respiratory effort. No accessory muscle use.  Cardiovascular: Regular rate and rhythm, no murmurs / rubs /  gallops. No extremity edema. 2+ pedal pulses. No carotid bruits.  Abdomen: tenderness on epigastric area, no rebound no guarding, no masses palpated. No hepatosplenomegaly. Bowel sounds positive.  Musculoskeletal: no clubbing / cyanosis. No joint deformity upper and lower extremities. Good ROM, no contractures. Normal muscle tone.  Skin: no rashes, lesions, ulcers. No induration Neurologic: No facial droop, moving all limbs, following simple command.  Psychiatric: Agitated with pain   Labs on Admission: I have personally reviewed following labs and imaging studies  CBC: Recent Labs  Lab 09/04/20 1317  WBC 21.4*  NEUTROABS 18.6*  HGB 11.5*  HCT 38.1  MCV 77.6*  PLT 182   Basic Metabolic Panel: Recent Labs  Lab 09/04/20 1317  NA 141  K 3.8  CL 106  CO2 27  GLUCOSE 161*  BUN 22  CREATININE 1.02*  CALCIUM 9.9   GFR: CrCl cannot be calculated (Unknown ideal weight.). Liver Function Tests: Recent Labs  Lab 09/04/20 1317  AST 16  ALT 15  ALKPHOS 78  BILITOT 0.9  PROT 6.4*  ALBUMIN 3.8   Recent Labs  Lab 09/04/20 1317  LIPASE 27   No results for input(s): AMMONIA in the last 168 hours. Coagulation Profile: No results for input(s): INR, PROTIME in the last 168 hours. Cardiac Enzymes: No results for input(s): CKTOTAL, CKMB, CKMBINDEX, TROPONINI in the last 168 hours. BNP (last 3 results) No results for input(s): PROBNP in the last 8760 hours. HbA1C: No results for input(s): HGBA1C in the last 72 hours. CBG: No results for input(s): GLUCAP in the last 168 hours. Lipid Profile: No results for input(s): CHOL, HDL, LDLCALC, TRIG, CHOLHDL, LDLDIRECT in the last 72 hours. Thyroid Function Tests: No results for input(s): TSH, T4TOTAL, FREET4, T3FREE, THYROIDAB in the last 72 hours. Anemia Panel: No results for input(s): VITAMINB12, FOLATE, FERRITIN, TIBC, IRON, RETICCTPCT in the last 72 hours. Urine analysis:    Component Value Date/Time   COLORURINE YELLOW  09/04/2020 [place] 09/04/2020 1645   LABSPEC 1.026 09/04/2020 1645   PHURINE 9.0 (H) 09/04/2020 1645   GLUCOSEU NEGATIVE 09/04/2020 1645   HGBUR NEGATIVE 09/04/2020 [place] 09/04/2020 1645   [place] 09/04/2020 1645   PROTEINUR 100 (A) 09/04/2020 1645   UROBILINOGEN 0.2 02/15/2013 1020   NITRITE NEGATIVE 09/04/2020 1645   LEUKOCYTESUR NEGATIVE 09/04/2020 1645    Radiological Exams on Admission: CT ABDOMEN PELVIS W CONTRAST  Addendum Date: 09/04/2020   ADDENDUM REPORT: 09/04/2020 16:34 ADDENDUM: These results were called by telephone at the time of interpretation on 09/04/2020 at 4:33 pm to provider Pati Gallo, who verbally acknowledged these results. Electronically Signed   By: Markus Daft M.D.   On: 09/04/2020 16:34   Result Date: 09/04/2020 CLINICAL DATA:  Epigastric pain and vomiting blood. EXAM: CT ABDOMEN AND PELVIS WITH CONTRAST TECHNIQUE: Multidetector CT imaging of the abdomen and pelvis was performed using the standard protocol following bolus administration of intravenous contrast. CONTRAST:  124mL OMNIPAQUE IOHEXOL 300 MG/ML  SOLN COMPARISON:  04/24/2020 and chest CT 07/10/2020 FINDINGS: Lower chest: Trace right pleural fluid. Volume loss in the right lower lobe. Atelectasis and volume loss in left lower lobe. Hepatobiliary: Normal appearance of the liver, gallbladder and portal venous system. No biliary dilatation. Pancreas: Unremarkable. No pancreatic ductal dilatation or surrounding inflammatory changes. Spleen: Normal in size without focal abnormality. Adrenals/Urinary Tract: Normal appearance of the adrenal glands. Hypodensities in both kidneys likely represent bilateral renal cysts but some are too small to definitively characterize. Again noted is a peripheral calcification involving the right renal cortex. Fluid in the urinary bladder. Difficult to exclude focal wall thickening in the left posterior bladder on sequence 3, image 73.  No hydronephrosis. Stomach/Bowel: Again noted is a large hiatal hernia. Hiatal hernia contains a large amount of fluid and the degree of gastric distension has markedly increased since February 2002. There is a portion of the stomach extending into the left upper abdomen that was not present on the previous examination. Entire stomach is not imaged and there is concern for wall thickening involving the distal stomach. Unusual configuration of the stomach on sequence 7, image 86. The morphology and distension of the stomach is suggestive for a gastric volvulus. Normal appearance of the duodenum. Normal appearance of the colon and appendix. Vascular/Lymphatic: Atherosclerotic disease without abdominal aortic aneurysm. Reproductive: Status post hysterectomy. No adnexal masses. Other: Negative for ascites.  Negative for free air. Musculoskeletal: Surgical fixation involving the spinous processes L4 and L5. Minimal anterolisthesis at L4-L5. IMPRESSION: 1. Severe distension of the stomach with a large hiatal hernia. The configuration is suggestive for a gastric volvulus. 2. Trace right pleural fluid. 3. Multiple renal cysts. Some of the renal cysts are too small to definitively characterize. 4. Question focal wall thickening along the posterior left bladder. This area is poorly characterized and may be better evaluated with dedicated hematuria protocol in the future. Electronically Signed: By: Markus Daft M.D. On: 09/04/2020 16:28   DG Chest Portable 1 View  Result Date: 09/04/2020 CLINICAL DATA:  Emesis with concern for gastrointestinal bleeding EXAM: PORTABLE CHEST 1 VIEW COMPARISON:  Chest radiograph and chest CT July 10, 2020; chest CT January 12, 2016 FINDINGS: There is a persistent nodular opacity in the right upper lobe, stable compared to prior study from 2017. There is atelectatic change in the lung bases. Heart is slightly enlarged, stable. Pulmonary vascularity is normal. No adenopathy. There is aortic  atherosclerosis. There is a large paraesophageal type hernia primarily to the right of midline, noted previously. No evident bone lesions. IMPRESSION: 1.  Large paraesophageal type hernia again noted. 2. Nodular opacity right upper lobe measuring 2.0 x 0.9 cm, stable since 2017 and felt to have benign etiology given stability over this time interval. 3.  Bibasilar atelectasis. 4.  Stable cardiac prominence. 5.  Aortic Atherosclerosis (ICD10-I70.0). Electronically Signed   By: Lowella Grip III M.D.   On: 09/04/2020 13:56    EKG: ordered.  Assessment/Plan Active Problems:   Volvulus of stomach  (please populate well all problems here in Problem List. (For example, if patient is on BP meds at home and you resume or decide to hold them, it is a problem that needs to be her. Same for CAD, COPD, HLD and so  on)  Acute gastric volvulus -Discussed with ED physician, agreed with NGT to decompress. -N.p.o., IV fluid -Pain management, surgeon consulted, who will see patient emergently tonight.  GI also consulted. --Check lactic acid  Coffee-ground emesis -Likely related to stomach volvulus -NPO and PPI BID -CBC in AM. -No chemical DVT prophylaxis for now.  Leukocytosis -Probably reactive, no cough, no infiltrates on x-ray, UA pending. -Monitor off ABX for now.  Uncontrolled hypertension -Ordered as needed hydralazine, expect patient will not able to take p.o. medication tonight.  Advanced dementia -As needed Zyprexa -Continue SSRI and risperidone  DVT prophylaxis: Code Status: SCD Family Communication: Daughter at bedside Disposition Plan: Expect 2 to 3 days hospital stay Consults called: GI and Surgery Admission status: Med surg admit   Lequita Halt MD [facility] Pager [phone]  09/04/2020, 5:34 PM

## 2020-09-04 NOTE — ED Notes (Signed)
Patient transported to CT 

## 2020-09-05 ENCOUNTER — Encounter (HOSPITAL_COMMUNITY): Payer: Self-pay | Admitting: Anesthesiology

## 2020-09-05 ENCOUNTER — Encounter (HOSPITAL_COMMUNITY)
Admission: EM | Disposition: A | Discharge status: Hospice | Payer: Self-pay | Source: Home / Self Care | Attending: Internal Medicine

## 2020-09-05 ENCOUNTER — Inpatient Hospital Stay (HOSPITAL_COMMUNITY): Payer: Medicare Other

## 2020-09-05 DIAGNOSIS — K3189 Other diseases of stomach and duodenum: Secondary | ICD-10-CM | POA: Diagnosis not present

## 2020-09-05 DIAGNOSIS — K920 Hematemesis: Secondary | ICD-10-CM | POA: Diagnosis not present

## 2020-09-05 DIAGNOSIS — K449 Diaphragmatic hernia without obstruction or gangrene: Secondary | ICD-10-CM

## 2020-09-05 LAB — URINALYSIS, ROUTINE W REFLEX MICROSCOPIC
Bacteria, UA: NONE SEEN
Bilirubin Urine: NEGATIVE
Glucose, UA: NEGATIVE mg/dL
Hgb urine dipstick: NEGATIVE
Ketones, ur: NEGATIVE mg/dL
Leukocytes,Ua: NEGATIVE
Nitrite: NEGATIVE
Protein, ur: 100 mg/dL — AB
Specific Gravity, Urine: >1.046 — ABNORMAL HIGH (ref 1.005–1.030)
pH: 6 (ref 5.0–8.0)

## 2020-09-05 LAB — CBC
HCT: 33.5 % — ABNORMAL LOW (ref 36.0–46.0)
Hemoglobin: 10.5 g/dL — ABNORMAL LOW (ref 12.0–15.0)
MCH: 24.5 pg — ABNORMAL LOW (ref 26.0–34.0)
MCHC: 31.3 g/dL (ref 30.0–36.0)
MCV: 78.1 fL — ABNORMAL LOW (ref 80.0–100.0)
Platelets: 244 10*3/uL (ref 150–400)
RBC: 4.29 MIL/uL (ref 3.87–5.11)
RDW: 16.4 % — ABNORMAL HIGH (ref 11.5–15.5)
WBC: 23.4 10*3/uL — ABNORMAL HIGH (ref 4.0–10.5)
nRBC: 0 % (ref 0.0–0.2)

## 2020-09-05 LAB — BASIC METABOLIC PANEL
Anion gap: 9 (ref 5–15)
BUN: 22 mg/dL (ref 8–23)
CO2: 28 mmol/L (ref 22–32)
Calcium: 9.4 mg/dL (ref 8.9–10.3)
Chloride: 107 mmol/L (ref 98–111)
Creatinine, Ser: 1.16 mg/dL — ABNORMAL HIGH (ref 0.44–1.00)
GFR, Estimated: 46 mL/min — ABNORMAL LOW (ref >60)
Glucose, Bld: 109 mg/dL — ABNORMAL HIGH (ref 70–99)
Potassium: 4.2 mmol/L (ref 3.5–5.1)
Sodium: 144 mmol/L (ref 135–145)

## 2020-09-05 LAB — TSH: TSH: 0.82 u[IU]/mL (ref 0.350–4.500)

## 2020-09-05 LAB — MRSA PCR SCREENING: MRSA by PCR: POSITIVE — AB

## 2020-09-05 SURGERY — CANCELLED PROCEDURE

## 2020-09-05 MED ORDER — ONDANSETRON 4 MG PO TBDP: 4.0000 mg | ORAL_TABLET | Freq: Four times a day (QID) | ORAL | Status: DC | PRN

## 2020-09-05 MED ORDER — LORAZEPAM 2 MG/ML PO CONC
0.5000 mg | ORAL | Status: DC | PRN
  Administered 2020-09-07 (×3): 0.5 mg via SUBLINGUAL
  Filled 2020-09-05 (×3): qty 1

## 2020-09-05 MED ORDER — PANTOPRAZOLE SODIUM 40 MG IV SOLR
40.0000 mg | Freq: Every day | INTRAVENOUS | Status: DC
  Administered 2020-09-06: 40 mg via INTRAVENOUS
  Filled 2020-09-05: qty 40

## 2020-09-05 MED ORDER — GLYCOPYRROLATE 0.2 MG/ML IJ SOLN: 0.2000 mg | INTRAMUSCULAR | Status: DC | PRN

## 2020-09-05 MED ORDER — ONDANSETRON HCL 4 MG/2ML IJ SOLN
4.0000 mg | Freq: Four times a day (QID) | INTRAMUSCULAR | Status: DC | PRN
Start: 2020-09-05 — End: 2020-09-08

## 2020-09-05 MED ORDER — HYDROMORPHONE HCL 1 MG/ML IJ SOLN
0.5000 mg | INTRAMUSCULAR | Status: DC | PRN
  Administered 2020-09-07 – 2020-09-08 (×3): 0.5 mg via INTRAVENOUS
  Filled 2020-09-05 (×3): qty 1

## 2020-09-05 MED ORDER — LORAZEPAM 2 MG/ML IJ SOLN
0.5000 mg | INTRAMUSCULAR | Status: DC | PRN
  Administered 2020-09-05 – 2020-09-06 (×4): 0.5 mg via INTRAVENOUS
  Filled 2020-09-05 (×3): qty 1

## 2020-09-05 MED ORDER — ACETAMINOPHEN 325 MG PO TABS: 650.0000 mg | ORAL_TABLET | Freq: Four times a day (QID) | ORAL | Status: DC | PRN

## 2020-09-05 MED ORDER — OXYCODONE HCL 20 MG/ML PO CONC
5.0000 mg | ORAL | Status: DC | PRN
  Administered 2020-09-06 – 2020-09-07 (×3): 5 mg via ORAL
  Filled 2020-09-05: qty 1

## 2020-09-05 MED ORDER — LORAZEPAM 2 MG/ML IJ SOLN
1.0000 mg | INTRAMUSCULAR | Status: DC | PRN
  Administered 2020-09-06 – 2020-09-07 (×2): 1 mg via INTRAVENOUS
  Filled 2020-09-05 (×3): qty 1

## 2020-09-05 MED ORDER — OXYCODONE HCL 20 MG/ML PO CONC
5.0000 mg | ORAL | Status: DC | PRN
  Administered 2020-09-07 (×4): 5 mg via SUBLINGUAL
  Filled 2020-09-05 (×6): qty 1

## 2020-09-05 MED ORDER — DIPHENHYDRAMINE HCL 50 MG/ML IJ SOLN: 12.5000 mg | INTRAMUSCULAR | Status: DC | PRN

## 2020-09-05 MED ORDER — LORAZEPAM 0.5 MG PO TABS
0.5000 mg | ORAL_TABLET | ORAL | Status: DC | PRN
  Filled 2020-09-05: qty 1

## 2020-09-05 MED ORDER — HALOPERIDOL LACTATE 2 MG/ML PO CONC
0.5000 mg | ORAL | Status: DC | PRN
  Filled 2020-09-05: qty 0.3

## 2020-09-05 MED ORDER — HALOPERIDOL LACTATE 5 MG/ML IJ SOLN
0.5000 mg | INTRAMUSCULAR | Status: DC | PRN
  Administered 2020-09-06 – 2020-09-08 (×7): 0.5 mg via INTRAVENOUS
  Filled 2020-09-05 (×6): qty 1

## 2020-09-05 MED ORDER — GLYCOPYRROLATE 1 MG PO TABS
1.0000 mg | ORAL_TABLET | ORAL | Status: DC | PRN
  Filled 2020-09-05: qty 1

## 2020-09-05 MED ORDER — ACETAMINOPHEN 650 MG RE SUPP: 650.0000 mg | Freq: Four times a day (QID) | RECTAL | Status: DC | PRN

## 2020-09-05 MED ORDER — HALOPERIDOL 0.5 MG PO TABS
0.5000 mg | ORAL_TABLET | ORAL | Status: DC | PRN
  Filled 2020-09-05 (×2): qty 1

## 2020-09-05 MED ORDER — CHLORHEXIDINE GLUCONATE CLOTH 2 % EX PADS
6.0000 | MEDICATED_PAD | Freq: Every day | CUTANEOUS | Status: DC
  Administered 2020-09-05: 6 via TOPICAL

## 2020-09-05 MED ORDER — MUPIROCIN 2 % EX OINT
1.0000 "application " | TOPICAL_OINTMENT | Freq: Two times a day (BID) | CUTANEOUS | Status: DC
  Administered 2020-09-05 – 2020-09-08 (×6): 1 via NASAL
  Filled 2020-09-05 (×2): qty 22

## 2020-09-05 SURGICAL SUPPLY — 14 items

## 2020-09-05 NOTE — Progress Notes (Addendum)
The patient's daughter has decided not to proceed with EGD. UGI bleeding has stopped. NGT output is now minimal. Continue NGT for now. Continue PPI bid. Surgical consult is following. No additional recommendations. GI signing off and we are available if needed.

## 2020-09-05 NOTE — Progress Notes (Signed)
Daughter wanted to talk to me as charge nurse about the use of a sitter for approximately 4 hours from 4-8 pm. I spoke to [facility] and this would be in the best interest of the patient since she has restraints and has a NGT that she is trying to pull out.  Daughter wants the private sitter for reassurance to the patient that everything is OK.  I told the daughter that it would be OK and to let us know once it has been scheduled for the times. The name of the company is [employer].

## 2020-09-05 NOTE — Anesthesia Preprocedure Evaluation (Deleted)
Anesthesia Evaluation  Patient identified by MRN, date of birth, ID band Patient awake    Reviewed: Allergy & Precautions, H&P , NPO status , Patient's Chart, lab work & pertinent test results, reviewed documented beta blocker date and time   Airway        Dental   Pulmonary neg pulmonary ROS,           Cardiovascular Exercise Tolerance: Good hypertension, Pt. on medications and Pt. on home beta blockers + dysrhythmias      Neuro/Psych PSYCHIATRIC DISORDERS Dementia negative neurological ROS     GI/Hepatic hiatal hernia,   Endo/Other    Renal/GU   negative genitourinary   Musculoskeletal   Abdominal   Peds  Hematology  (+) Blood dyscrasia, anemia ,   Anesthesia Other Findings   Reproductive/Obstetrics negative OB ROS                            Anesthesia Physical Anesthesia Plan  ASA: IV  Anesthesia Plan: General   Post-op Pain Management:    Induction:   PONV Risk Score and Plan: 3 and Propofol infusion  Airway Management Planned: Natural Airway, Nasal Cannula and Simple Face Mask  Additional Equipment:   Intra-op Plan:   Post-operative Plan:   Informed Consent: I have reviewed the patients History and Physical, chart, labs and discussed the procedure including the risks, benefits and alternatives for the proposed anesthesia with the patient or authorized representative who has indicated his/her understanding and acceptance.     Dental Advisory Given  Plan Discussed with: CRNA  Anesthesia Plan Comments:         Anesthesia Quick Evaluation

## 2020-09-05 NOTE — Progress Notes (Signed)
Progress Note     Subjective: Patient resting comfortably this AM, awakened briefly and denies abdominal pain. Reportedly NGT has not put anything out since NGT placement in the ED and initial return of 1L gastric contents. Discussed with patient's daughter that often in these situations it is indicated to place a feeding tube to help anchor the stomach to the abdominal wall. She reports that she would not want her mother to have a feeding tube placed. She at this time does not think she would want her mother to have a surgery.   Objective: Vital signs in last 24 hours: Temp:  [97.7 F (36.5 C)-98.4 F (36.9 C)] 97.7 F (36.5 C) (04/07 0528) Pulse Rate:  [35-84] 80 (04/07 0528) Resp:  [12-29] 20 (04/06 2212) BP: (124-213)/(50-127) 139/58 (04/07 0528) SpO2:  [59 %-98 %] 93 % (04/07 0528)    Intake/Output from previous day: 04/06 0701 - 04/07 0700 In: 539.8 [I.V.:539.8] Out: 450 [Urine:450] Intake/Output this shift: No intake/output data recorded.  PE: General: pleasant, WD, WN elderly female who is laying in bed in NAD Heart: regular, rate, and rhythm.   Lungs: CTAB, no wheezes, rhonchi, or rales noted.  Respiratory effort nonlabored Abd: soft, NT, ND, NGT present with some clot material in tubing    Lab Results:  Recent Labs    09/04/20 1317 09/05/20 0219  WBC 21.4* 23.4*  HGB 11.5* 10.5*  HCT 38.1 33.5*  PLT 270 244   BMET Recent Labs    09/04/20 1317 09/05/20 0219  NA 141 144  K 3.8 4.2  CL 106 107  CO2 27 28  GLUCOSE 161* 109*  BUN 22 22  CREATININE 1.02* 1.16*  CALCIUM 9.9 9.4   PT/INR No results for input(s): LABPROT, INR in the last 72 hours. CMP     Component Value Date/Time   NA 144 09/05/2020 0219   K 4.2 09/05/2020 0219   CL 107 09/05/2020 0219   CO2 28 09/05/2020 0219   GLUCOSE 109 (H) 09/05/2020 0219   BUN 22 09/05/2020 0219   CREATININE 1.16 (H) 09/05/2020 0219   CALCIUM 9.4 09/05/2020 0219   PROT 6.4 (L) 09/04/2020 1317    ALBUMIN 3.8 09/04/2020 1317   AST 16 09/04/2020 1317   ALT 15 09/04/2020 1317   ALKPHOS 78 09/04/2020 1317   BILITOT 0.9 09/04/2020 1317   GFRNONAA 46 (L) 09/05/2020 0219   GFRAA 50 (L) 01/13/2016 0732   Lipase     Component Value Date/Time   LIPASE 27 09/04/2020 1317       Studies/Results: CT ABDOMEN PELVIS W CONTRAST  Addendum Date: 09/04/2020   ADDENDUM REPORT: 09/04/2020 16:34 ADDENDUM: These results were called by telephone at the time of interpretation on 09/04/2020 at 4:33 pm to provider Pati Gallo, who verbally acknowledged these results. Electronically Signed   By: Markus Daft M.D.   On: 09/04/2020 16:34   Result Date: 09/04/2020 CLINICAL DATA:  Epigastric pain and vomiting blood. EXAM: CT ABDOMEN AND PELVIS WITH CONTRAST TECHNIQUE: Multidetector CT imaging of the abdomen and pelvis was performed using the standard protocol following bolus administration of intravenous contrast. CONTRAST:  115mL OMNIPAQUE IOHEXOL 300 MG/ML  SOLN COMPARISON:  04/24/2020 and chest CT 07/10/2020 FINDINGS: Lower chest: Trace right pleural fluid. Volume loss in the right lower lobe. Atelectasis and volume loss in left lower lobe. Hepatobiliary: Normal appearance of the liver, gallbladder and portal venous system. No biliary dilatation. Pancreas: Unremarkable. No pancreatic ductal dilatation or surrounding inflammatory changes. Spleen: Normal  in size without focal abnormality. Adrenals/Urinary Tract: Normal appearance of the adrenal glands. Hypodensities in both kidneys likely represent bilateral renal cysts but some are too small to definitively characterize. Again noted is a peripheral calcification involving the right renal cortex. Fluid in the urinary bladder. Difficult to exclude focal wall thickening in the left posterior bladder on sequence 3, image 73. No hydronephrosis. Stomach/Bowel: Again noted is a large hiatal hernia. Hiatal hernia contains a large amount of fluid and the degree of gastric  distension has markedly increased since February 2002. There is a portion of the stomach extending into the left upper abdomen that was not present on the previous examination. Entire stomach is not imaged and there is concern for wall thickening involving the distal stomach. Unusual configuration of the stomach on sequence 7, image 86. The morphology and distension of the stomach is suggestive for a gastric volvulus. Normal appearance of the duodenum. Normal appearance of the colon and appendix. Vascular/Lymphatic: Atherosclerotic disease without abdominal aortic aneurysm. Reproductive: Status post hysterectomy. No adnexal masses. Other: Negative for ascites.  Negative for free air. Musculoskeletal: Surgical fixation involving the spinous processes L4 and L5. Minimal anterolisthesis at L4-L5. IMPRESSION: 1. Severe distension of the stomach with a large hiatal hernia. The configuration is suggestive for a gastric volvulus. 2. Trace right pleural fluid. 3. Multiple renal cysts. Some of the renal cysts are too small to definitively characterize. 4. Question focal wall thickening along the posterior left bladder. This area is poorly characterized and may be better evaluated with dedicated hematuria protocol in the future. Electronically Signed: By: Markus Daft M.D. On: 09/04/2020 16:28   DG Chest Portable 1 View  Result Date: 09/04/2020 CLINICAL DATA:  Emesis with concern for gastrointestinal bleeding EXAM: PORTABLE CHEST 1 VIEW COMPARISON:  Chest radiograph and chest CT July 10, 2020; chest CT January 12, 2016 FINDINGS: There is a persistent nodular opacity in the right upper lobe, stable compared to prior study from 2017. There is atelectatic change in the lung bases. Heart is slightly enlarged, stable. Pulmonary vascularity is normal. No adenopathy. There is aortic atherosclerosis. There is a large paraesophageal type hernia primarily to the right of midline, noted previously. No evident bone lesions.  IMPRESSION: 1.  Large paraesophageal type hernia again noted. 2. Nodular opacity right upper lobe measuring 2.0 x 0.9 cm, stable since 2017 and felt to have benign etiology given stability over this time interval. 3.  Bibasilar atelectasis. 4.  Stable cardiac prominence. 5.  Aortic Atherosclerosis (ICD10-I70.0). Electronically Signed   By: Lowella Grip III M.D.   On: 09/04/2020 13:56   DG Abd Portable 1V  Result Date: 09/05/2020 CLINICAL DATA:  Gastrointestinal bleeding EXAM: PORTABLE ABDOMEN - 1 VIEW COMPARISON:  September 04, 2020 abdominal radiograph and CT abdomen and pelvis September 04, 2020 FINDINGS: Nasogastric tube tip and side port are present within a sizable Esophageal hernia. There is no bowel dilatation or air-fluid level to suggest bowel obstruction. No free air. Postoperative change noted in the lower lumbar region. Atelectatic change noted in the lung bases. IMPRESSION: Nasogastric tube tip and side port are within a large paraesophageal hernia. No bowel obstruction or free air evident on supine examination. Electronically Signed   By: Lowella Grip III M.D.   On: 09/05/2020 09:45   DG Abd Portable 1V-Small Bowel Protocol-Position Verification  Result Date: 09/04/2020 CLINICAL DATA:  87 year old female with NG tube placement. EXAM: PORTABLE ABDOMEN - 1 VIEW COMPARISON:  CT abdomen pelvis dated 09/04/2020. FINDINGS: There  is a large hiatal hernia containing much of the stomach as seen on the earlier CT. The enteric tube with side-port to the right of the midline and tip over the spine above the diaphragm within the herniated stomach. No bowel dilatation noted within the abdomen. Excreted contrast noted in the renal collecting system. No acute osseous pathology. IMPRESSION: Enteric tube with side-port and tip above the diaphragm within the herniated stomach. Electronically Signed   By: Anner Crete M.D.   On: 09/04/2020 20:38    Anti-infectives: Anti-infectives (From admission, onward)    None       Assessment/Plan Leukocytosis - unclear etiology, afebrile  Hx of lung cancer s/p radiation 10+yrs ago Dementia   Large hiatal hernia with hematemesis - NGT placed in ED with 1L out, no further output  - GI consulted and planning EGD later today vs tomorrow pending availability   - discussed possibility of feeding tube placement to pexy stomach to abdominal wall with daughter who reports they would not want a feeding tube placed, she also reports at this time they likely would not be comfortable with surgical intervention  - I recommend continuing NGT and PPI for now - I will consult palliative care to further outline and clarify goals of care as well  - no emergent surgical intervention planned at this time  FEN: NPO, IVF, NGT to LIWS VTE: SCDs ID: no current abx  LOS: 1 day    Norm Parcel , [facility] 09/05/2020, 9:58 AM Please see Amion for pager number during day hours 7:00am-4:30pm

## 2020-09-05 NOTE — Progress Notes (Signed)
[facility] Progress Note  Patient: Jeanne Compton    XIP:[id number]  DOA: 09/04/2020     Date of Service: the patient was seen and examined on 09/05/2020  Brief hospital course: Past medical history of Alzheimer's dementia, HTN, lung cancer SP radiation, GERD, large hiatal hernia, from a memory care unit.  Presents with complaints of abdominal pain as well as hematemesis.  Concern for gastric volvulus on admission.  GI and general surgery were consulted. Currently plan is comfort care.  Assessment and Plan: 1.  Acute gastric volvulus. Large hiatal hernia. Appreciate general surgery and GI consultation. NG tube inserted which appears to be decompressing the stomach. X-ray shows no evidence of significant gastric distention. No significant output on low intermittent suction. General surgery recommended that the patient is not a surgical candidate for hiatal hernia. Also PEG tube pexy we will not solve her degree of hiatal hernia. Per GI patient is actually not a candidate for any pexy procedure as her stomach is about diaphragm. EGD was done to look for source of bleeding.  But bleeding appears to have stopped I already. Family decided to not proceed with any intervention. She may have untwisted her volvulus on her own. Given the nature of untreatable condition of large hiatal hernia family wants to proceed with comfort care.  2.  Hematemesis Currently hemodynamically stable other than mild tachycardia. H&H stable. Continue PPI daily. Continue NG tube.  3.  Leukocytosis Reactive in nature. No evidence of acute infection. Monitor. Now comfort care.  4.  HTN Blood pressure elevated on admission likely in response to pain. Currently well controlled.  Monitor.  5.  Alzheimer's dementia Agitation Requiring restraints. As needed Haldol. Not sure if she can tolerate p.o. medication.  Therefore we will be holding her home regimen.  6.  Goals of care conversation Patient was a  DNR. Not a surgical candidate for her large hiatal hernia. Not a candidate for pexy as well. With 10 pound weight loss in the last few days. Remains agitated intermittently due to her dementia. Prognosis is very poor. Appears to have decreased urine output in last 8 to 12 hours despite aggressive IV hydration. Family preferred to transition to complete comfort. Patient will be a ideal candidate for residential hospice for now. TOC consulted.  Diet: N.p.o. DVT Prophylaxis:   Place and maintain sequential compression device Start: 09/04/20 1742    Advance goals of care discussion: DNR  Family Communication: family was present at bedside, at the time of interview.  The pt provided permission to discuss medical plan with the family. Opportunity was given to ask question and all questions were answered satisfactorily.   Disposition:  Status is: Inpatient  Remains inpatient appropriate because: Continue comfort care and monitor for progression  Dispo: The patient is from: Memory care unit              Anticipated d/c is to: Residential hospice              Patient currently is not medically stable to d/c.   Difficult to place patient No        Subjective: No nausea no vomiting.  Patient sleepy and lethargic throughout the day.  Physical Exam:  General: Appear in mild distress, no Rash; Oral Mucosa Clear, dry. no Abnormal Neck Mass Or lumps, Conjunctiva normal  Cardiovascular: S1 and S2 Present, no Murmur, tachycardic Respiratory: good respiratory effort, Bilateral Air entry present and CTA, no Crackles, no wheezes Abdomen: Bowel Sound present, Soft and  difficult to assess tenderness Extremities: no Pedal edema Neurology: Lethargic and sleepy, not oriented. no new focal deficit Gait not checked due to patient safety concerns  Vitals:   09/04/20 2212 09/04/20 2249 09/05/20 0022 09/05/20 0528  BP: (!) 178/61 (!) 172/74 (!) 124/52 (!) 139/58  Pulse: 73 78 84 80  Resp: 20      Temp:  98.4 F (36.9 C)  97.7 F (36.5 C)  TempSrc:  Oral Oral Oral  SpO2: 98% 91%  93%    Intake/Output Summary (Last 24 hours) at 09/05/2020 1926 Last data filed at 09/05/2020 0745 Gross per 24 hour  Intake 539.83 ml  Output 450 ml  Net 89.83 ml   There were no vitals filed for this visit.  Data Reviewed: I have personally reviewed and interpreted daily labs, tele strips, imaging. I reviewed all nursing notes, pharmacy notes, vitals, pertinent old records I have discussed plan of care as described above with RN and patient/family.  CBC: Recent Labs  Lab 09/04/20 1317 09/05/20 0219  WBC 21.4* 23.4*  NEUTROABS 18.6*  --   HGB 11.5* 10.5*  HCT 38.1 33.5*  MCV 77.6* 78.1*  PLT 270 350   Basic Metabolic Panel: Recent Labs  Lab 09/04/20 1317 09/05/20 0219  NA 141 144  K 3.8 4.2  CL 106 107  CO2 27 28  GLUCOSE 161* 109*  BUN 22 22  CREATININE 1.02* 1.16*  CALCIUM 9.9 9.4    Studies: DG Abd Portable 1V  Result Date: 09/05/2020 CLINICAL DATA:  Gastrointestinal bleeding EXAM: PORTABLE ABDOMEN - 1 VIEW COMPARISON:  September 04, 2020 abdominal radiograph and CT abdomen and pelvis September 04, 2020 FINDINGS: Nasogastric tube tip and side port are present within a sizable Esophageal hernia. There is no bowel dilatation or air-fluid level to suggest bowel obstruction. No free air. Postoperative change noted in the lower lumbar region. Atelectatic change noted in the lung bases. IMPRESSION: Nasogastric tube tip and side port are within a large paraesophageal hernia. No bowel obstruction or free air evident on supine examination. Electronically Signed   By: Lowella Grip III M.D.   On: 09/05/2020 09:45   DG Abd Portable 1V-Small Bowel Protocol-Position Verification  Result Date: 09/04/2020 CLINICAL DATA:  87 year old female with NG tube placement. EXAM: PORTABLE ABDOMEN - 1 VIEW COMPARISON:  CT abdomen pelvis dated 09/04/2020. FINDINGS: There is a large hiatal hernia containing much  of the stomach as seen on the earlier CT. The enteric tube with side-port to the right of the midline and tip over the spine above the diaphragm within the herniated stomach. No bowel dilatation noted within the abdomen. Excreted contrast noted in the renal collecting system. No acute osseous pathology. IMPRESSION: Enteric tube with side-port and tip above the diaphragm within the herniated stomach. Electronically Signed   By: Anner Crete M.D.   On: 09/04/2020 20:38    Scheduled Meds: . Chlorhexidine Gluconate Cloth  6 each Topical Q0600  . mupirocin ointment  1 application Nasal BID  . [START ON 09/06/2020] pantoprazole (PROTONIX) IV  40 mg Intravenous Daily   Continuous Infusions: PRN Meds: acetaminophen **OR** acetaminophen, diphenhydrAMINE, glycopyrrolate **OR** glycopyrrolate **OR** glycopyrrolate, haloperidol **OR** haloperidol **OR** haloperidol lactate, hydrocortisone, HYDROmorphone (DILAUDID) injection, LORazepam **OR** LORazepam **OR** LORazepam, LORazepam, ondansetron **OR** ondansetron (ZOFRAN) IV, oxyCODONE **OR** oxyCODONE  Time spent: 35 minutes  Author: Berle Mull, MD [facility] 09/05/2020 7:26 PM  To reach On-call, see care teams to locate the attending and reach out via [email or web address]. Between 7PM-7AM, please  contact night-coverage If you still have difficulty reaching the attending provider, please page the [facility] (Director on Call) for [facility] on amion for assistance.

## 2020-09-05 NOTE — Progress Notes (Signed)
Pt continues to be very agitated and confused. Constantly trying to pull lines and tubes. Pt had a small urine incontinent episode. Bladder showed 316ml in the bladder. Made NP on call aware. Awaiting response.

## 2020-09-05 NOTE — Progress Notes (Addendum)
[facility] Progress Note  CC:  Hematemesis, large paraesophageal hernia, possible gastric volvulus   Subjective:  She is resting comfortably at this time. Daughter at bedside. No further hematemesis. No BM. Dr. Fuller Plan spoke with daughter this am, discussed EGD in full detail.   Objective:   Chest Xray 09/04/2020: 1. Large paraesophageal type hernia again noted. 2. Nodular opacity right upper lobe measuring 2.0 x 0.9 cm, stable since 2017 and felt to have benign etiology given stability over this time interval. 3. Bibasilar atelectasis. 4. Stable cardiac prominence. 5. Aortic Atherosclerosis   Abd/pelvic CT 64/10/2020: 1. Severe distension of the stomach with a large hiatal hernia. The configuration is suggestive for a gastric volvulus. 2. Trace right pleural fluid. 3. Multiple renal cysts. Some of the renal cysts are too small to definitively characterize. 4. Question focal wall thickening along the posterior left bladder. This area is poorly characterized and may be better evaluated with dedicated hematuria protocol in the future.  Vital signs in last 24 hours: Temp:  [97.7 F (36.5 C)-98.4 F (36.9 C)] 97.7 F (36.5 C) (04/07 0528) Pulse Rate:  [35-84] 80 (04/07 0528) Resp:  [12-29] 20 (04/06 2212) BP: (124-213)/(50-127) 139/58 (04/07 0528) SpO2:  [59 %-98 %] 93 % (04/07 0528)   General: Resting comfortably, confused in NAD.  Heart: RRR, no murmur.  Pulm:  Breath sounds clear throughout.  Abdomen: Soft, nondistended. NGT to LIS. Hypoactive BS auscultated when NG briefly clamped. No mass.  Extremities:  Without edema. Neurologic:  Awakens when name called. Currently not agitated. Resting comfortably.   Intake/Output from previous day: 04/06 0701 - 04/07 0700 In: 539.8 [I.V.:539.8] Out: 450 [Urine:450] Intake/Output this shift: No intake/output data recorded.  Lab Results: Recent Labs    09/04/20 1317 09/05/20 0219  WBC 21.4* 23.4*   HGB 11.5* 10.5*  HCT 38.1 33.5*  PLT 270 244   BMET Recent Labs    09/04/20 1317 09/05/20 0219  NA 141 144  K 3.8 4.2  CL 106 107  CO2 27 28  GLUCOSE 161* 109*  BUN 22 22  CREATININE 1.02* 1.16*  CALCIUM 9.9 9.4   LFT Recent Labs    09/04/20 1317  PROT 6.4*  ALBUMIN 3.8  AST 16  ALT 15  ALKPHOS 78  BILITOT 0.9    CT ABDOMEN PELVIS W CONTRAST  Addendum Date: 09/04/2020   ADDENDUM REPORT: 09/04/2020 16:34 ADDENDUM: These results were called by telephone at the time of interpretation on 09/04/2020 at 4:33 pm to provider Pati Gallo, who verbally acknowledged these results. Electronically Signed   By: Markus Daft M.D.   On: 09/04/2020 16:34   Result Date: 09/04/2020 CLINICAL DATA:  Epigastric pain and vomiting blood. EXAM: CT ABDOMEN AND PELVIS WITH CONTRAST TECHNIQUE: Multidetector CT imaging of the abdomen and pelvis was performed using the standard protocol following bolus administration of intravenous contrast. CONTRAST:  166mL OMNIPAQUE IOHEXOL 300 MG/ML  SOLN COMPARISON:  04/24/2020 and chest CT 07/10/2020 FINDINGS: Lower chest: Trace right pleural fluid. Volume loss in the right lower lobe. Atelectasis and volume loss in left lower lobe. Hepatobiliary: Normal appearance of the liver, gallbladder and portal venous system. No biliary dilatation. Pancreas: Unremarkable. No pancreatic ductal dilatation or surrounding inflammatory changes. Spleen: Normal in size without focal abnormality. Adrenals/Urinary Tract: Normal appearance of the adrenal glands. Hypodensities in both kidneys likely represent bilateral renal cysts but some are too small to definitively characterize. Again noted is a peripheral calcification involving the right  renal cortex. Fluid in the urinary bladder. Difficult to exclude focal wall thickening in the left posterior bladder on sequence 3, image 73. No hydronephrosis. Stomach/Bowel: Again noted is a large hiatal hernia. Hiatal hernia contains a large amount of  fluid and the degree of gastric distension has markedly increased since February 2002. There is a portion of the stomach extending into the left upper abdomen that was not present on the previous examination. Entire stomach is not imaged and there is concern for wall thickening involving the distal stomach. Unusual configuration of the stomach on sequence 7, image 86. The morphology and distension of the stomach is suggestive for a gastric volvulus. Normal appearance of the duodenum. Normal appearance of the colon and appendix. Vascular/Lymphatic: Atherosclerotic disease without abdominal aortic aneurysm. Reproductive: Status post hysterectomy. No adnexal masses. Other: Negative for ascites.  Negative for free air. Musculoskeletal: Surgical fixation involving the spinous processes L4 and L5. Minimal anterolisthesis at L4-L5. IMPRESSION: 1. Severe distension of the stomach with a large hiatal hernia. The configuration is suggestive for a gastric volvulus. 2. Trace right pleural fluid. 3. Multiple renal cysts. Some of the renal cysts are too small to definitively characterize. 4. Question focal wall thickening along the posterior left bladder. This area is poorly characterized and may be better evaluated with dedicated hematuria protocol in the future. Electronically Signed: By: Markus Daft M.D. On: 09/04/2020 16:28   DG Chest Portable 1 View  Result Date: 09/04/2020 CLINICAL DATA:  Emesis with concern for gastrointestinal bleeding EXAM: PORTABLE CHEST 1 VIEW COMPARISON:  Chest radiograph and chest CT July 10, 2020; chest CT January 12, 2016 FINDINGS: There is a persistent nodular opacity in the right upper lobe, stable compared to prior study from 2017. There is atelectatic change in the lung bases. Heart is slightly enlarged, stable. Pulmonary vascularity is normal. No adenopathy. There is aortic atherosclerosis. There is a large paraesophageal type hernia primarily to the right of midline, noted previously. No  evident bone lesions. IMPRESSION: 1.  Large paraesophageal type hernia again noted. 2. Nodular opacity right upper lobe measuring 2.0 x 0.9 cm, stable since 2017 and felt to have benign etiology given stability over this time interval. 3.  Bibasilar atelectasis. 4.  Stable cardiac prominence. 5.  Aortic Atherosclerosis (ICD10-I70.0). Electronically Signed   By: Lowella Grip III M.D.   On: 09/04/2020 13:56   DG Abd Portable 1V-Small Bowel Protocol-Position Verification  Result Date: 09/04/2020 CLINICAL DATA:  87 year old female with NG tube placement. EXAM: PORTABLE ABDOMEN - 1 VIEW COMPARISON:  CT abdomen pelvis dated 09/04/2020. FINDINGS: There is a large hiatal hernia containing much of the stomach as seen on the earlier CT. The enteric tube with side-port to the right of the midline and tip over the spine above the diaphragm within the herniated stomach. No bowel dilatation noted within the abdomen. Excreted contrast noted in the renal collecting system. No acute osseous pathology. IMPRESSION: Enteric tube with side-port and tip above the diaphragm within the herniated stomach. Electronically Signed   By: Anner Crete M.D.   On: 09/04/2020 20:38    Assessment / Plan:  70.  87 year old female with dementia and history of GERD, large hiatal hernia and recurrent hematemesis admitted to the hospital 4/6 with an upper GI bleed/hematemesis and stable anemia.  Hemoglobin 11.5 (baseline Hg 11.6 on 07/10/2020). Today Hg 10.5. No further hematemesis today. Chest xray showed a large paraesophageal hernia seen on prior image studies. CTAP showed  a large paraesophageal hiatal hernia.  Radiologist raised concerns for gastric volvulus. Surgery does not feel that emergent surgery is needed at this time.  -EGD with Dr. Fuller Plan this afternoon if time slot available in endo, may need to be done tomorrow. EGD benefits and risks discussed including risk with sedation, risk of bleeding, perforation and infection  -No  plans for double PEG placement (to pex stomach down) as a significant portion of her stomach remains above the diaphram -NPO -IV fluids and pain management per the hospitalist -Continue PPI IV bid -Zofran 4mg  IV Q 6 hrs PRN -Further recommendations per Dr. Fuller Plan  2. Leukocytosis, unclear etiology. WBC 23.4. No fever today.   3. History of lung cancer s/p radiation 10+ years ago    LOS: 1 day   Noralyn Pick  09/05/2020, 9:07 AM      Attending Physician Note   I have taken an interval history, reviewed the chart and examined the patient. I agree with the Advanced Practitioner's note, impression and recommendations.   Hematemesis, large paresophageal hiatal hernia with possible gastric volvulus. Recommend proceeding with EGD today to further evaluate bleeding and possible volvulus. Dual PEG placement to pex a gastric volvulus is an option when the majority of the stomach is below the diaphragm, so it is not an option in this case. I spoke at length with the patients daughter at bedside and the patients son, who is an ID physician, by phone. She is higher risk for sedation and endoscopic procedures. They understand the risks, benefits and alternatives to EGD and are considering the recommendation to proceed with EGD. She is scheduled for EGD at 1:30 pm today pending consent from the daughter.   Lucio Edward, MD FACG [phone]

## 2020-09-06 DIAGNOSIS — Z515 Encounter for palliative care: Secondary | ICD-10-CM

## 2020-09-06 DIAGNOSIS — I160 Hypertensive urgency: Secondary | ICD-10-CM | POA: Diagnosis not present

## 2020-09-06 NOTE — Progress Notes (Signed)
Noted plans to transition to comfort care. Surgery will sign off at this time but are certainly available if anything changes or further discussion is needed.   Norm Parcel, [facility] Surgery 09/06/2020, 7:55 AM Please see Amion for pager number during day hours 7:00am-4:30pm

## 2020-09-06 NOTE — Progress Notes (Signed)
[facility] Progress Note  Patient: Jeanne [facility]    GMW:[id number]  DOA: 09/04/2020     Date of Service: the patient was seen and examined on 09/06/2020  Brief hospital course: Past medical history of Alzheimer's dementia, HTN, lung cancer SP radiation, GERD, large hiatal hernia, from a memory care unit.  Presents with complaints of abdominal pain as well as hematemesis.  Concern for gastric volvulus on admission.  GI and general surgery were consulted. Currently plan is comfort care.  Assessment and Plan: 1.  Acute gastric volvulus. Large hiatal hernia. Appreciate general surgery and GI consultation. NG tube inserted which appears to be decompressing the stomach. X-ray shows no evidence of significant gastric distention. No significant output on low intermittent suction. General surgery recommended that the patient is not a surgical candidate for hiatal hernia. Also PEG tube pexy we will not solve her degree of hiatal hernia. Per GI patient is actually not a candidate for any pexy procedure as her stomach is about diaphragm. EGD was done to look for source of bleeding.  But bleeding appears to have stopped I already. Family decided to not proceed with any intervention. She may have untwisted her volvulus on her own. Given the nature of untreatable condition of large hiatal hernia family wants to proceed with comfort care.  2.  Hematemesis Currently hemodynamically stable other than mild tachycardia. H&H stable. Continue PPI daily. Continue NG tube.  3.  Leukocytosis Reactive in nature. No evidence of acute infection. Monitor. Now comfort care.  4.  HTN Blood pressure elevated on admission likely in response to pain. Currently well controlled.  Monitor.  5.  Alzheimer's dementia Agitation Requiring restraints. As needed Haldol. Not sure if she can tolerate p.o. medication.  Therefore we will be holding her home regimen.  6.  Goals of care conversation Patient was a  DNR. Not a surgical candidate for her large hiatal hernia. Not a candidate for pexy as well. With 10 pound weight loss in the last few days. Remains agitated intermittently due to her dementia. Prognosis is very poor. Appears to have decreased urine output in last 8 to 12 hours despite aggressive IV hydration. Family preferred to transition to complete comfort. Patient will be a ideal candidate for residential hospice for now. TOC consulted.  4/8.  Not a lot of medication required to keep the patient comfortable.  Last dose of Ativan was 0.5 mg at 1 AM.  Foley catheter inserted.  NG tube removed by the patient.  Remains comfortable and stable for discharge to residential hospice.  Diet: Comfort feeding. DVT Prophylaxis:   Comfort care.    Advance goals of care discussion: DNR  Family Communication: family was present at bedside, at the time of interview.  Opportunity was given to ask question and all questions were answered satisfactorily.   Disposition:  Status is: Inpatient  Remains inpatient appropriate because: Continue comfort care and monitor for progression  Dispo: The patient is from: Memory care unit              Anticipated d/c is to: Residential hospice              Patient currently is medically stable to d/c.   Difficult to place patient No  Subjective: Remains comfortable.  Restraints removed.  No nausea no vomiting.  NG tube removed last night by the patient.  Foley catheter inserted.  Physical Exam:  General: Appear in mild distress, no Rash; Oral Mucosa Clear, moist. no Abnormal Neck Mass  Or lumps, Conjunctiva normal  Cardiovascular: S1 and S2 Present, no Murmur, Respiratory: good respiratory effort, Bilateral Air entry present and CTA, no Crackles, no wheezes Abdomen: Bowel Sound present, Soft and difficult to assess tenderness Extremities: no Pedal edema Neurology: Lethargic, nonverbal.  No focal deficit.  Vitals:   09/04/20 2249 09/05/20 0022 09/05/20  0528 09/05/20 2052  BP: (!) 172/74 (!) 124/52 (!) 139/58 (!) 155/69  Pulse: 78 84 80 76  Resp:    19  Temp: 98.4 F (36.9 C)  97.7 F (36.5 C) 98.7 F (37.1 C)  TempSrc: Oral Oral Oral Oral  SpO2: 91%  93% 94%    Intake/Output Summary (Last 24 hours) at 09/06/2020 1940 Last data filed at 09/06/2020 1800 Gross per 24 hour  Intake 0 ml  Output 650 ml  Net -650 ml   There were no vitals filed for this visit.  Data Reviewed: I have personally reviewed and interpreted daily labs, tele strips, imaging. I reviewed all nursing notes, pharmacy notes, vitals, pertinent old records I have discussed plan of care as described above with RN and patient/family.  CBC: Recent Labs  Lab 09/04/20 1317 09/05/20 0219  WBC 21.4* 23.4*  NEUTROABS 18.6*  --   HGB 11.5* 10.5*  HCT 38.1 33.5*  MCV 77.6* 78.1*  PLT 270 710   Basic Metabolic Panel: Recent Labs  Lab 09/04/20 1317 09/05/20 0219  NA 141 144  K 3.8 4.2  CL 106 107  CO2 27 28  GLUCOSE 161* 109*  BUN 22 22  CREATININE 1.02* 1.16*  CALCIUM 9.9 9.4    Studies: No results found.  Scheduled Meds: . Chlorhexidine Gluconate Cloth  6 each Topical Q0600  . mupirocin ointment  1 application Nasal BID  . pantoprazole (PROTONIX) IV  40 mg Intravenous Daily   Continuous Infusions: PRN Meds: acetaminophen **OR** acetaminophen, diphenhydrAMINE, glycopyrrolate **OR** glycopyrrolate **OR** glycopyrrolate, haloperidol **OR** haloperidol **OR** haloperidol lactate, hydrocortisone, HYDROmorphone (DILAUDID) injection, LORazepam **OR** LORazepam **OR** LORazepam, LORazepam, ondansetron **OR** ondansetron (ZOFRAN) IV, oxyCODONE **OR** oxyCODONE  Time spent: 35 minutes  Author: Berle Mull, MD [facility] 09/06/2020 7:40 PM  To reach On-call, see care teams to locate the attending and reach out via [email or web address]. Between 7PM-7AM, please contact night-coverage If you still have difficulty reaching the attending provider, please page  the [facility] (Director on Call) for [facility] on amion for assistance.

## 2020-09-06 NOTE — Progress Notes (Signed)
   Palliative Medicine Inpatient Follow Up Note  Palliative care consulted upon admission though since then Dr. Posey Pronto has converted patient to comfort oriented care and consulted TOC for inpatient hospice.   At this time we will not see Kynzli as all pertinent points have been addressed though, if needed moving forward we will be readily available to re-involved.   NO Charge ______________________________________________________________________________________ [place] Palliative Medicine Team Team Cell Phone: [phone] Please utilize secure chat with additional questions, if there is no response within 30 minutes please call the above phone number  Palliative Medicine Team providers are available by phone from 7am to 7pm daily and can be reached through the team cell phone.  Should this patient require assistance outside of these hours, please call the patient's attending physician.

## 2020-09-07 DIAGNOSIS — I160 Hypertensive urgency: Secondary | ICD-10-CM | POA: Diagnosis not present

## 2020-09-07 MED ORDER — CLONAZEPAM 0.5 MG PO TABS
0.5000 mg | ORAL_TABLET | Freq: Two times a day (BID) | ORAL | Status: DC
  Administered 2020-09-07 (×2): 0.5 mg via ORAL
  Filled 2020-09-07 (×2): qty 1

## 2020-09-07 MED ORDER — RISPERIDONE 0.25 MG PO TABS
0.2500 mg | ORAL_TABLET | Freq: Every day | ORAL | Status: DC
  Administered 2020-09-07: 0.25 mg via ORAL
  Filled 2020-09-07 (×2): qty 1

## 2020-09-07 NOTE — Progress Notes (Signed)
[facility] Progress Note  Patient: Jeanne [facility]    ZWC:[id number]  DOA: 09/04/2020     Date of Service: the patient was seen and examined on 09/07/2020  Brief hospital course: Past medical history of Alzheimer's dementia, HTN, lung cancer SP radiation, GERD, large hiatal hernia, from a memory care unit.  Presents with complaints of abdominal pain as well as hematemesis.  Concern for gastric volvulus on admission.  GI and general surgery were consulted. Currently plan is arrange for a residential hospice.  Assessment and Plan: 1.  Acute gastric volvulus. Large hiatal hernia. Appreciate general surgery and GI consultation. NG tube inserted which appears to be decompressing the stomach. X-ray shows no evidence of significant gastric distention. No significant output on low intermittent suction. General surgery recommended that the patient is not a surgical candidate for hiatal hernia. Also PEG tube pexy we will not solve her degree of hiatal hernia. Per GI patient is actually not a candidate for any pexy procedure as her stomach is about diaphragm. EGD was done to look for source of bleeding.  But bleeding appears to have stopped I already. Family decided to not proceed with any intervention. She may have untwisted her volvulus on her own. Given the nature of untreatable condition of large hiatal hernia family wants to proceed with comfort care.  2.  Hematemesis Currently hemodynamically stable other than mild tachycardia. H&H stable. Continue PPI daily. Patient pulled out the NG tube.  No bleeding reported.  Monitor.  3.  Leukocytosis Reactive in nature. No evidence of acute infection. Monitor. Now comfort care.  4.  HTN Blood pressure elevated on admission likely in response to pain. Currently well controlled.  Monitor.  5.  Alzheimer's dementia Agitation Intermittently requiring restraint.  Currently off. As needed Haldol. Resuming some of the home medication to see  if she can tolerate p.o. or not.  6.  Goals of care conversation Patient was a DNR. Not a surgical candidate for her large hiatal hernia. Not a candidate for pexy as well. With 10 pound weight loss in the last few days. Remains agitated intermittently due to her dementia. Prognosis is very poor. Appears to have decreased urine output in last 8 to 12 hours despite aggressive IV hydration. Family preferred to transition to complete comfort. Patient will be a ideal candidate for residential hospice for now. TOC consulted.  4/8.  Not a lot of medication required to keep the patient comfortable.  Last dose of Ativan was 0.5 mg at 1 AM.  Foley catheter inserted.  NG tube removed by the patient.  Remains comfortable and stable for discharge to residential hospice. 4/9 patient pulled out Foley catheter.  Currently monitoring.  Answering questions appropriately although remains confused about her whereabouts.  Diet: Comfort feeding. DVT Prophylaxis:   Comfort care.   Advance goals of care discussion: DNR  Family Communication: no family was present at bedside, at the time of interview.   Disposition:  Status is: Inpatient  Remains inpatient appropriate because: Continue comfort care and arranging for residential hospice.  Dispo: The patient is from: Memory care unit              Anticipated d/c is to: Residential hospice awaiting TOC discussion with the family.              Patient currently is medically stable to d/c.   Difficult to place patient No  Subjective: Remains comfortable.  Pulled out her Foley catheter.  Physical Exam:  General: Appear in  mild distress, no Rash; Oral Mucosa Clear, moist. no Abnormal Neck Mass Or lumps, Conjunctiva normal  Cardiovascular: S1 and S2 Present, no Murmur, Respiratory: good respiratory effort, Bilateral Air entry present and CTA, no Crackles, no wheezes Abdomen: Bowel Sound present, Soft and no tenderness Extremities: no Pedal  edema Neurology: Drowsy, not oriented And oriented to time, place, and person affect appropriate. no new focal deficit Gait not checked due to patient safety concerns   Vitals:   09/05/20 0022 09/05/20 0528 09/05/20 2052 09/06/20 1900  BP: (!) 124/52 (!) 139/58 (!) 155/69 (!) 161/54  Pulse: 84 80 76 79  Resp:   19 18  Temp:  97.7 F (36.5 C) 98.7 F (37.1 C) 98 F (36.7 C)  TempSrc: Oral Oral Oral Oral  SpO2:  93% 94%     Intake/Output Summary (Last 24 hours) at 09/07/2020 1807 Last data filed at 09/07/2020 0819 Gross per 24 hour  Intake --  Output 500 ml  Net -500 ml   There were no vitals filed for this visit.  Data Reviewed: I have personally reviewed and interpreted daily labs, tele strips, imaging. I reviewed all nursing notes, pharmacy notes, vitals, pertinent old records I have discussed plan of care as described above with RN and patient/family.  CBC: Recent Labs  Lab 09/04/20 1317 09/05/20 0219  WBC 21.4* 23.4*  NEUTROABS 18.6*  --   HGB 11.5* 10.5*  HCT 38.1 33.5*  MCV 77.6* 78.1*  PLT 270 893   Basic Metabolic Panel: Recent Labs  Lab 09/04/20 1317 09/05/20 0219  NA 141 144  K 3.8 4.2  CL 106 107  CO2 27 28  GLUCOSE 161* 109*  BUN 22 22  CREATININE 1.02* 1.16*  CALCIUM 9.9 9.4    Studies: No results found.  Scheduled Meds: . clonazePAM  0.5 mg Oral BID  . mupirocin ointment  1 application Nasal BID  . risperiDONE  0.25 mg Oral QHS   Continuous Infusions: PRN Meds: acetaminophen **OR** acetaminophen, diphenhydrAMINE, glycopyrrolate **OR** glycopyrrolate **OR** glycopyrrolate, haloperidol **OR** haloperidol **OR** haloperidol lactate, hydrocortisone, HYDROmorphone (DILAUDID) injection, LORazepam **OR** LORazepam **OR** LORazepam, LORazepam, ondansetron **OR** ondansetron (ZOFRAN) IV, oxyCODONE **OR** oxyCODONE  Time spent: 35 minutes  Author: Berle Mull, MD [facility] 09/07/2020 6:07 PM  To reach On-call, see care teams to locate  the attending and reach out via [email or web address]. Between 7PM-7AM, please contact night-coverage If you still have difficulty reaching the attending provider, please page the [facility] (Director on Call) for [facility] on amion for assistance.

## 2020-09-07 NOTE — Progress Notes (Signed)
Pt pulled foley catheter out with balloon intact. No bleeding noted. Berle Mull, MD paged and aware. No new orders at this time. Will continue to monitor.

## 2020-09-08 DIAGNOSIS — I160 Hypertensive urgency: Secondary | ICD-10-CM | POA: Diagnosis not present

## 2020-09-08 MED ORDER — WHITE PETROLATUM EX OINT
TOPICAL_OINTMENT | CUTANEOUS | Status: AC
  Filled 2020-09-08: qty 28.35

## 2020-09-08 NOTE — Discharge Summary (Signed)
[facility] Discharge Summary   Patient: Jeanne Compton RUE:[id number]  PCP: Reymundo Poll, MD  Date of admission: 09/04/2020   Date of discharge:  09/08/2020     Discharge Diagnoses:  Principal diagnosis Volvulus of stomach Hematemesis  Large hiatal hernia  Dementia  Admitted From: memory care unit Disposition:   Residential hospice   Recommendations for Outpatient Follow-up:  1. Establish care with Palliative care and hospice    Discharge Instructions    Diet general   Complete by: As directed    Increase activity slowly   Complete by: As directed       Diet recommendation: comfort feeds  Activity: The patient is advised to gradually reintroduce usual activities, as tolerated  Discharge Condition: stable  Code Status: DNR   History of present illness: As per the H and P dictated on admission, "Jeanne Compton is a 87 y.o. female with medical history significant of Alzheimer's dementia, HTN, lung cancer status post radiation, GERD, presented with acute onset of abdominal pain and nauseous and vomiting.  Patient baseline dementia, history provided by daughter at bedside.  Daughter reported patient has had decreased appetite for last 1 to 42-month, lost 10+ pounds.  This morning, patient's family visited her and nursing home and patient complained about new onset of stomach aching and then vomited 3 times of dark colored stomach content. ED Course: CT abd showed acute stomach volvulus.  Blood pressure significantly elevated."  Hospital Course:  Summary of her active problems in the hospital is as following.   1.  Acute gastric volvulus. Large hiatal hernia. Appreciate general surgery and GI consultation. NG tube inserted to decompressing the stomach. Repeat X-ray shows no evidence of significant gastric distention. General surgery recommended that the patient is not a surgical candidate for hiatal hernia. PEG tube pexy will not solve her degree of hiatal hernia. Per  GI patient is actually not a candidate for any pexy procedure as her stomach is above diaphragm. EGD was planned to look for source of bleeding. But bleeding appears to have stopped I already. So Family decided to not proceed with any intervention/EGD. Per GI She may have untwisted her volvulus on her own. Given the nature of untreatable condition of large hiatal hernia family wants to proceed with comfort care.  2.  Hematemesis Currently hemodynamically stable other than mild tachycardia. H&H stable. Patient pulled out the NG tube.  No bleeding reported.  Monitor.  3.  Leukocytosis Reactive in nature. No evidence of acute infection. Monitor. Now comfort care.  4.  HTN Blood pressure elevated on admission likely in response to pain. Currently well controlled.  Monitor.  5.  Alzheimer's dementia Agitation Intermittently requiring restraint.  Currently off. As needed Haldol. Resuming some of the home medication to see if she can tolerate p.o. or not.  6.  Goals of care conversation Patient was a DNR. Not a surgical candidate for her large hiatal hernia. Not a candidate for pexy as well. With 10 pound weight loss in the last few days. Remains agitated intermittently due to her dementia. Prognosis is very poor. Appears to have decreased urine output Family preferred to transition to complete comfort. Patient will be a ideal candidate for residential hospice for now. TOC consulted. NG tube removed by the patient. patient pulled out Foley catheter.   On the day of the discharge the patient's vitals were stable, and no other acute medical condition were reported by patient. The patient was felt safe to be discharge at residential hospice  Consultants: General surgery Gastroenterology  Procedures: none  DISCHARGE MEDICATION: Allergies as of 09/08/2020      Reactions   Compazine [prochlorperazine]    Per MAR   Compazine [prochlorperazine] Other (See Comments)   Unknown       Medication List    TAKE these medications   atorvastatin 20 MG tablet Commonly known as: LIPITOR Take 20 mg by mouth at bedtime.   clonazePAM 0.5 MG tablet Commonly known as: KLONOPIN Take 0.5 mg by mouth 2 (two) times daily.   Fiber Laxative 0.52 g capsule Generic drug: psyllium Take 0.52 g by mouth at bedtime.   lisinopril 2.5 MG tablet Commonly known as: ZESTRIL Take 2.5 mg by mouth daily before breakfast.   melatonin 5 MG Tabs Take 5 mg by mouth at bedtime.   metoprolol succinate 25 MG 24 hr tablet Commonly known as: TOPROL-XL Take 12.5 mg by mouth daily.   Proctosol HC 2.5 % rectal cream Generic drug: hydrocortisone Place 1 application rectally every 8 (eight) hours as needed for hemorrhoids.   risperiDONE 0.25 MG tablet Commonly known as: RISPERDAL Take 0.25 mg by mouth daily at 12 noon. Given at 12:00   sertraline 50 MG tablet Commonly known as: ZOLOFT Take 50 mg by mouth at bedtime.       Discharge Exam: There were no vitals filed for this visit. Vitals:   09/05/20 2052 09/06/20 1900  BP: (!) 155/69 (!) 161/54  Pulse: 76 79  Resp: 19 18  Temp: 98.7 F (37.1 C) 98 F (36.7 C)  SpO2: 94%    General: Appear in no distress, no Rash; Oral Mucosa Clear, dry. no Abnormal Neck Mass Or lumps, Conjunctiva normal  Cardiovascular: S1 and S2 Present, no Murmur Respiratory: increased respiratory effort, Bilateral Air entry present and CTA, no Crackles, no wheezes Abdomen: Bowel Sound present Extremities: no Pedal edema Neurology: lethargic and not oriented to time, place, and person affect unresponsive. no new focal deficit  The results of significant diagnostics from this hospitalization (including imaging, microbiology, ancillary and laboratory) are listed below for reference.    Significant Diagnostic Studies: CT ABDOMEN PELVIS W CONTRAST  Addendum Date: 09/04/2020   ADDENDUM REPORT: 09/04/2020 16:34 ADDENDUM: These results were called by  telephone at the time of interpretation on 09/04/2020 at 4:33 pm to provider Pati Gallo, who verbally acknowledged these results. Electronically Signed   By: Markus Daft M.D.   On: 09/04/2020 16:34   Result Date: 09/04/2020 CLINICAL DATA:  Epigastric pain and vomiting blood. EXAM: CT ABDOMEN AND PELVIS WITH CONTRAST TECHNIQUE: Multidetector CT imaging of the abdomen and pelvis was performed using the standard protocol following bolus administration of intravenous contrast. CONTRAST:  148mL OMNIPAQUE IOHEXOL 300 MG/ML  SOLN COMPARISON:  04/24/2020 and chest CT 07/10/2020 FINDINGS: Lower chest: Trace right pleural fluid. Volume loss in the right lower lobe. Atelectasis and volume loss in left lower lobe. Hepatobiliary: Normal appearance of the liver, gallbladder and portal venous system. No biliary dilatation. Pancreas: Unremarkable. No pancreatic ductal dilatation or surrounding inflammatory changes. Spleen: Normal in size without focal abnormality. Adrenals/Urinary Tract: Normal appearance of the adrenal glands. Hypodensities in both kidneys likely represent bilateral renal cysts but some are too small to definitively characterize. Again noted is a peripheral calcification involving the right renal cortex. Fluid in the urinary bladder. Difficult to exclude focal wall thickening in the left posterior bladder on sequence 3, image 73. No hydronephrosis. Stomach/Bowel: Again noted is a large hiatal hernia. Hiatal hernia contains a large  amount of fluid and the degree of gastric distension has markedly increased since February 2002. There is a portion of the stomach extending into the left upper abdomen that was not present on the previous examination. Entire stomach is not imaged and there is concern for wall thickening involving the distal stomach. Unusual configuration of the stomach on sequence 7, image 86. The morphology and distension of the stomach is suggestive for a gastric volvulus. Normal appearance of the  duodenum. Normal appearance of the colon and appendix. Vascular/Lymphatic: Atherosclerotic disease without abdominal aortic aneurysm. Reproductive: Status post hysterectomy. No adnexal masses. Other: Negative for ascites.  Negative for free air. Musculoskeletal: Surgical fixation involving the spinous processes L4 and L5. Minimal anterolisthesis at L4-L5. IMPRESSION: 1. Severe distension of the stomach with a large hiatal hernia. The configuration is suggestive for a gastric volvulus. 2. Trace right pleural fluid. 3. Multiple renal cysts. Some of the renal cysts are too small to definitively characterize. 4. Question focal wall thickening along the posterior left bladder. This area is poorly characterized and may be better evaluated with dedicated hematuria protocol in the future. Electronically Signed: By: Markus Daft M.D. On: 09/04/2020 16:28   DG Chest Portable 1 View  Result Date: 09/04/2020 CLINICAL DATA:  Emesis with concern for gastrointestinal bleeding EXAM: PORTABLE CHEST 1 VIEW COMPARISON:  Chest radiograph and chest CT July 10, 2020; chest CT January 12, 2016 FINDINGS: There is a persistent nodular opacity in the right upper lobe, stable compared to prior study from 2017. There is atelectatic change in the lung bases. Heart is slightly enlarged, stable. Pulmonary vascularity is normal. No adenopathy. There is aortic atherosclerosis. There is a large paraesophageal type hernia primarily to the right of midline, noted previously. No evident bone lesions. IMPRESSION: 1.  Large paraesophageal type hernia again noted. 2. Nodular opacity right upper lobe measuring 2.0 x 0.9 cm, stable since 2017 and felt to have benign etiology given stability over this time interval. 3.  Bibasilar atelectasis. 4.  Stable cardiac prominence. 5.  Aortic Atherosclerosis (ICD10-I70.0). Electronically Signed   By: Lowella Grip III M.D.   On: 09/04/2020 13:56   DG Abd Portable 1V  Result Date: 09/05/2020 CLINICAL DATA:   Gastrointestinal bleeding EXAM: PORTABLE ABDOMEN - 1 VIEW COMPARISON:  September 04, 2020 abdominal radiograph and CT abdomen and pelvis September 04, 2020 FINDINGS: Nasogastric tube tip and side port are present within a sizable Esophageal hernia. There is no bowel dilatation or air-fluid level to suggest bowel obstruction. No free air. Postoperative change noted in the lower lumbar region. Atelectatic change noted in the lung bases. IMPRESSION: Nasogastric tube tip and side port are within a large paraesophageal hernia. No bowel obstruction or free air evident on supine examination. Electronically Signed   By: Lowella Grip III M.D.   On: 09/05/2020 09:45   DG Abd Portable 1V-Small Bowel Protocol-Position Verification  Result Date: 09/04/2020 CLINICAL DATA:  87 year old female with NG tube placement. EXAM: PORTABLE ABDOMEN - 1 VIEW COMPARISON:  CT abdomen pelvis dated 09/04/2020. FINDINGS: There is a large hiatal hernia containing much of the stomach as seen on the earlier CT. The enteric tube with side-port to the right of the midline and tip over the spine above the diaphragm within the herniated stomach. No bowel dilatation noted within the abdomen. Excreted contrast noted in the renal collecting system. No acute osseous pathology. IMPRESSION: Enteric tube with side-port and tip above the diaphragm within the herniated stomach. Electronically Signed   By:  Anner Crete M.D.   On: 09/04/2020 20:38    Microbiology: Recent Results (from the past 240 hour(s))  SARS CORONAVIRUS 2 (TAT 6-24 HRS) Nasopharyngeal Nasopharyngeal Swab     Status: None   Collection Time: 09/04/20  3:19 PM   Specimen: Nasopharyngeal Swab  Result Value Ref Range Status   SARS Coronavirus 2 NEGATIVE NEGATIVE Final    Comment: (NOTE) SARS-CoV-2 target nucleic acids are NOT DETECTED.  The SARS-CoV-2 RNA is generally detectable in upper and lower respiratory specimens during the acute phase of infection. Negative results do not  preclude SARS-CoV-2 infection, do not rule out co-infections with other pathogens, and should not be used as the sole basis for treatment or other patient management decisions. Negative results must be combined with clinical observations, patient history, and epidemiological information. The expected result is Negative.  Fact Sheet for Patients: SugarRoll.be  Fact Sheet for Healthcare Providers: https://www.woods-mathews.com/  This test is not yet approved or cleared by the [place] FDA and  has been authorized for detection and/or diagnosis of SARS-CoV-2 by FDA under an Emergency Use Authorization (EUA). This EUA will remain  in effect (meaning this test can be used) for the duration of the COVID-19 declaration under Se ction 564(b)(1) of the Act, 21 U.S.C. section 360bbb-3(b)(1), unless the authorization is terminated or revoked sooner.  Performed at [facility], [place] [address], [place], [place] [address]   MRSA PCR Screening     Status: Abnormal   Collection Time: 09/04/20 11:05 PM   Specimen: Nasal Mucosa; Nasopharyngeal  Result Value Ref Range Status   MRSA by PCR POSITIVE (A) NEGATIVE Final    Comment:        The GeneXpert MRSA Assay (FDA approved for NASAL specimens only), is one component of a comprehensive MRSA colonization surveillance program. It is not intended to diagnose MRSA infection nor to guide or monitor treatment for MRSA infections. RESULT CALLED TO, READ BACK BY AND VERIFIED WITH: [place] [id number] FCP Performed at [facility] [address], [place], [place] [address]      Labs: CBC: Recent Labs  Lab 09/04/20 1317 09/05/20 0219  WBC 21.4* 23.4*  NEUTROABS 18.6*  --   HGB 11.5* 10.5*  HCT 38.1 33.5*  MCV 77.6* 78.1*  PLT 270 258   Basic Metabolic Panel: Recent Labs  Lab 09/04/20 1317 09/05/20 0219  NA 141 144  K 3.8 4.2  CL 106 107  CO2 27 28  GLUCOSE 161* 109*   BUN 22 22  CREATININE 1.02* 1.16*  CALCIUM 9.9 9.4   Liver Function Tests: Recent Labs  Lab 09/04/20 1317  AST 16  ALT 15  ALKPHOS 78  BILITOT 0.9  PROT 6.4*  ALBUMIN 3.8   CBG: No results for input(s): GLUCAP in the last 168 hours.  Time spent: 35 minutes  Signed:  Berle Mull  [facility]  09/08/2020 3:10 PM

## 2020-09-08 NOTE — Progress Notes (Signed)
Manufacturing engineer [facility])  Referral received for residential hospice at [facility].  Dtr Manuela Schwartz had just left the bedside.  Spoke with her via phone, explained that I needed to get approval from our attending and then we can proceed with seeing if we have a bed at [facility].  [facility] will update family and hospital once determination has been made by our attending.  Venia Carbon RN, BSN, [facility] Liaison

## 2020-09-08 NOTE — TOC Transition Note (Signed)
Transition of Care [facility]) - CM/SW Discharge Note   Patient Details  Name: Jeanne Compton MRN: [id number] Date of Birth: 26-Feb-1932  Transition of Care [facility]) CM/SW Contact:  Bary Castilla, LCSW Phone Number:[phone] 09/08/2020, 3:29 PM   Clinical Narrative:     Patient will DC to:?[facility] Anticipated DC date:?09/17/2020 Family notified:?Jeanne Compton Transport by: Jeanne Compton   Per MD patient ready for DC to [facility]. RN, patient, patient's family, and facility notified of DC. Discharge Summary faxed to facility. RN given number for [phone] report. DC packet on chart. Ambulance transport requested for patient.   CSW signing off.   Jeanne Compton, [place] [phone]         Patient Goals and CMS Choice        Discharge Placement                       Discharge Plan and Services                                     Social Determinants of Health (SDOH) Interventions     Readmission Risk Interventions No flowsheet data found.

## 2020-09-08 NOTE — Progress Notes (Signed)
Manufacturing engineer [facility])  Ms. Glanz is appropriate for residential hospice at [facility].  Once consents are completed, transportation can be arranged by [facility].  [facility] will update TOC manager as soon as necessary consents are completed.  RN staff, you may call report to [phone] at any time. Room is assigned at this time.   Once completed, please fax d/c summary to [phone].  Venia Carbon RN, BSN, [facility] Liaison

## 2020-09-08 NOTE — TOC Progression Note (Addendum)
Transition of Care [facility]) - Progression Note    Patient Details  Name: Jeanne Compton MRN: [id number] Date of Birth: 02/14/1932  Transition of Care [facility]) CM/SW [place], [place] Phone Number: 09/08/2020, 11:43 AM  Clinical Narrative:     SW notified pt needing Hospice services.  SW met with pt's daughter Manuela Schwartz [phone]) regarding preferred Hospice agency. SW provided options in the pt area. Manuela Schwartz reports prefers [facility]) as it is in [place] and has good reviews.   SW left VM with [facility] [phone] to start referral  Update 1157am  SW received callback from Anderson Malta [facility]) reports will discuss with daughter Manuela Schwartz and hospice MD. Anderson Malta will call this SW back if pt approved and bed available   Expected Discharge Plan and Services  Social Determinants of Health (SDOH) Interventions    Readmission Risk Interventions No flowsheet data found.

## 2020-09-29 DEATH — deceased

## 2022-06-14 IMAGING — DX DG CHEST 1V PORT
1 series · 1 of 1 positions shown · non-contrast
Comparison: Chest radiograph and chest CT July 10, 2020; chest
CT January 12, 2016

CLINICAL DATA: Emesis with concern for gastrointestinal bleeding

EXAM:
PORTABLE CHEST 1 VIEW

[chest]
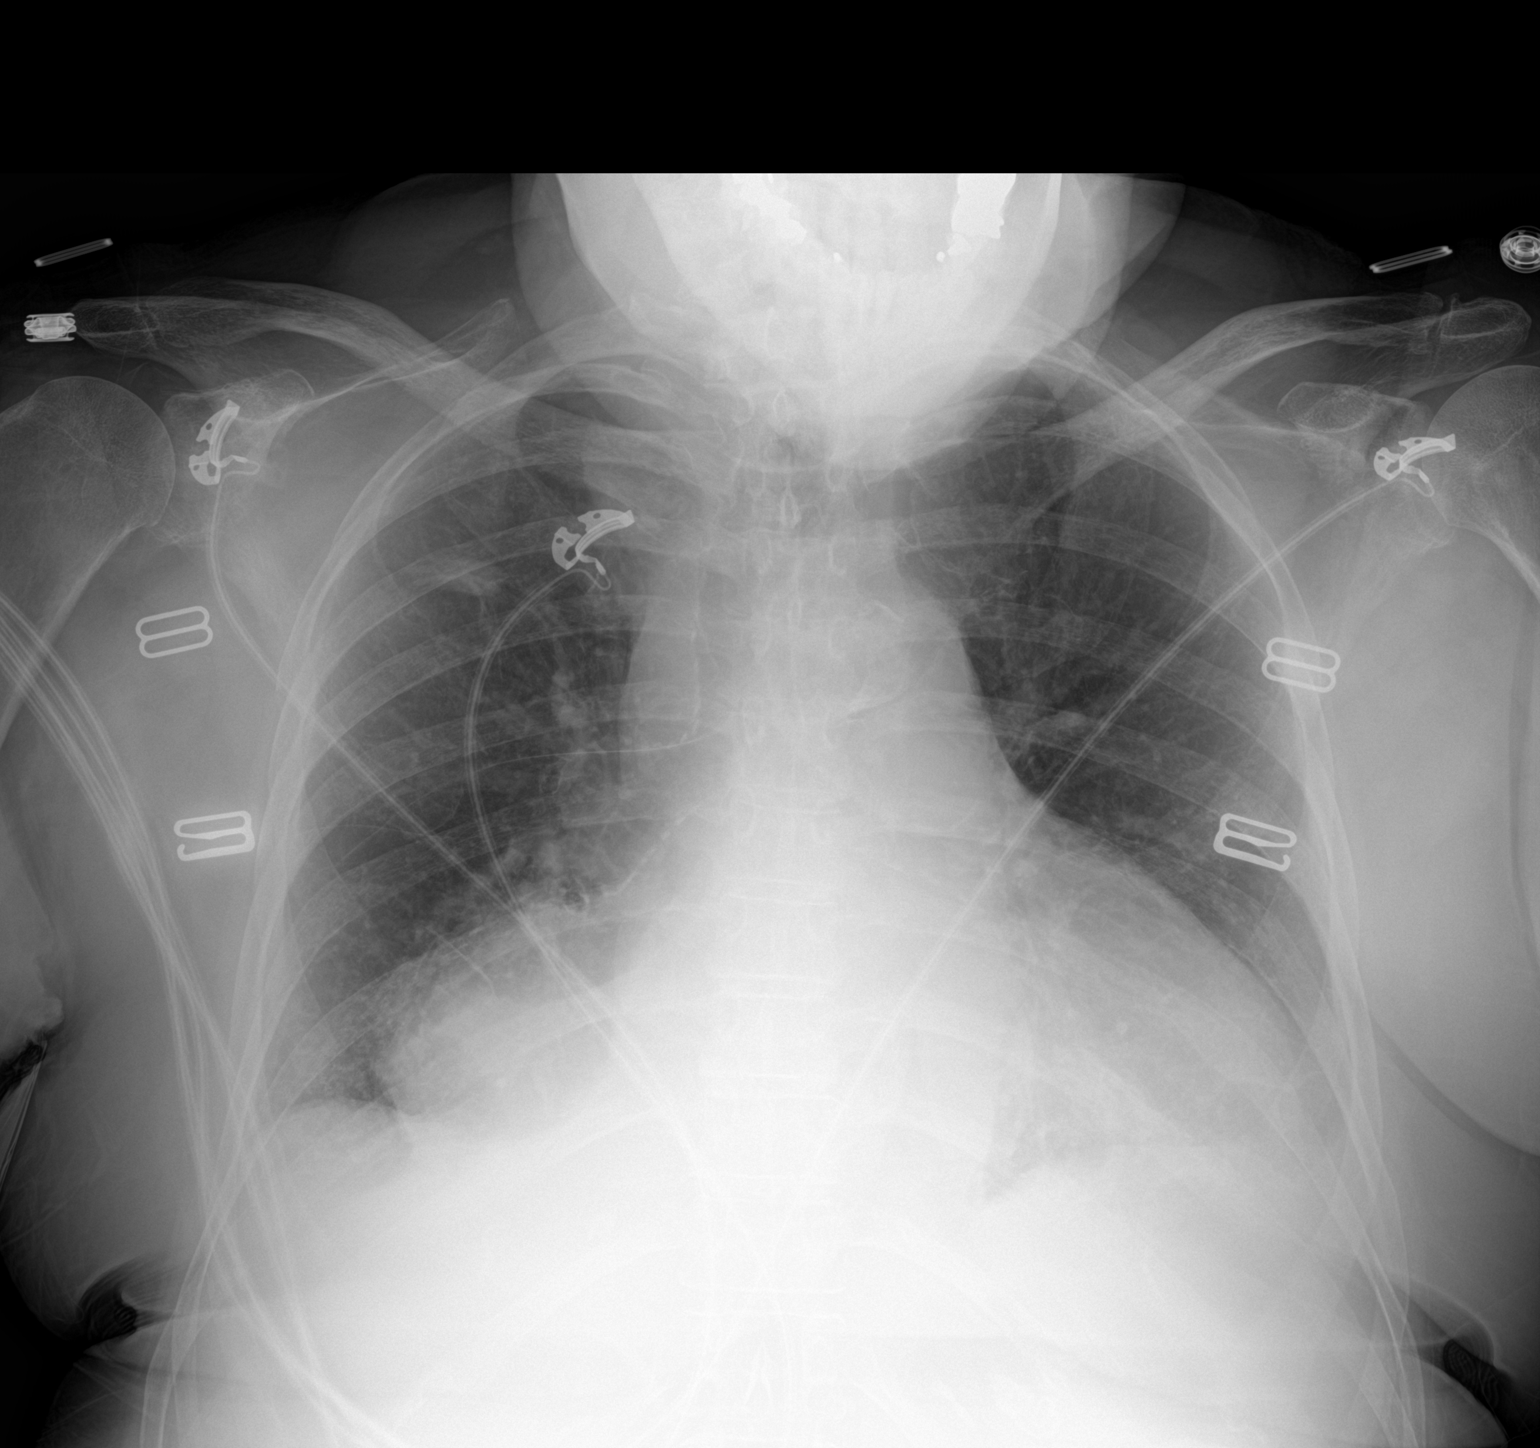

[1 of 1 positions shown; findings below may reference images not displayed]

FINDINGS: There is a persistent nodular opacity in the right upper lobe,
stable compared to prior study from 7089. There is atelectatic
change in the lung bases.

Heart is slightly enlarged, stable. Pulmonary vascularity is normal.
No adenopathy. There is aortic atherosclerosis. There is a large
paraesophageal type hernia primarily to the right of midline, noted
previously. No evident bone lesions.
IMPRESSION: 1.  Large paraesophageal type hernia again noted.

2. Nodular opacity right upper lobe measuring 2.0 x 0.9 cm, stable
since 7089 and felt to have benign etiology given stability over
this time interval.

3.  Bibasilar atelectasis.

4.  Stable cardiac prominence.

5.  Aortic Atherosclerosis ([id number].E).
